# Patient Record
Sex: Male | Born: 1960
Health system: Southern US, Community
[De-identification: ages and names within clinical notes are randomized; demographics above are authoritative.]

## PROBLEM LIST (undated history)

## (undated) DIAGNOSIS — F329 Major depressive disorder, single episode, unspecified: Secondary | ICD-10-CM

## (undated) DIAGNOSIS — E785 Hyperlipidemia, unspecified: Secondary | ICD-10-CM

## (undated) DIAGNOSIS — F32A Depression, unspecified: Secondary | ICD-10-CM

## (undated) DIAGNOSIS — F419 Anxiety disorder, unspecified: Secondary | ICD-10-CM

## (undated) DIAGNOSIS — M069 Rheumatoid arthritis, unspecified: Secondary | ICD-10-CM

## (undated) HISTORY — PX: EYE SURGERY: SHX253

## (undated) HISTORY — DX: Major depressive disorder, single episode, unspecified: F32.9

## (undated) HISTORY — PX: TONSILLECTOMY: SUR1361

## (undated) HISTORY — DX: Hyperlipidemia, unspecified: E78.5

## (undated) HISTORY — DX: Depression, unspecified: F32.A

## (undated) HISTORY — PX: APPENDECTOMY: SHX54

## (undated) HISTORY — DX: Anxiety disorder, unspecified: F41.9

## (undated) HISTORY — DX: Rheumatoid arthritis, unspecified: M06.9

---

## 1992-02-01 HISTORY — PX: STOMACH SURGERY: SHX791

## 2008-03-27 ENCOUNTER — Encounter (INDEPENDENT_AMBULATORY_CARE_PROVIDER_SITE_OTHER): Payer: Self-pay | Admitting: *Deleted

## 2008-10-07 ENCOUNTER — Ambulatory Visit: Payer: Self-pay | Admitting: Family Medicine

## 2008-10-07 DIAGNOSIS — E785 Hyperlipidemia, unspecified: Secondary | ICD-10-CM | POA: Insufficient documentation

## 2008-10-07 LAB — CONVERTED CEMR LAB: HDL goal, serum: 40 mg/dL

## 2008-10-08 LAB — CONVERTED CEMR LAB
Cholesterol: 207 mg/dL — ABNORMAL HIGH (ref 0–200)
HDL: 46.2 mg/dL (ref >39.00)
Total CHOL/HDL Ratio: 4
Triglycerides: 135 mg/dL (ref 0.0–149.0)
VLDL: 27 mg/dL (ref 0.0–40.0)

## 2010-11-24 ENCOUNTER — Ambulatory Visit (INDEPENDENT_AMBULATORY_CARE_PROVIDER_SITE_OTHER): Payer: BC Managed Care – PPO | Admitting: Family Medicine

## 2010-11-24 ENCOUNTER — Encounter: Payer: Self-pay | Admitting: Family Medicine

## 2010-11-24 VITALS — BP 124/84 | HR 78 | Ht 75.0 in | Wt 189.0 lb

## 2010-11-24 DIAGNOSIS — N410 Acute prostatitis: Secondary | ICD-10-CM

## 2010-11-24 DIAGNOSIS — N453 Epididymo-orchitis: Secondary | ICD-10-CM

## 2010-11-24 DIAGNOSIS — N451 Epididymitis: Secondary | ICD-10-CM

## 2010-11-24 DIAGNOSIS — Z209 Contact with and (suspected) exposure to unspecified communicable disease: Secondary | ICD-10-CM

## 2010-11-24 MED ORDER — DOXYCYCLINE HYCLATE 100 MG PO TABS: 100.0000 mg | ORAL_TABLET | Freq: Two times a day (BID) | ORAL | Status: AC

## 2010-11-24 NOTE — Patient Instructions (Signed)
Take the Avelox right now and start the doxycycline as soon as you can

## 2010-11-24 NOTE — Progress Notes (Signed)
  Subjective:    Patient ID: Jacob Pena, male    DOB: [birth year], 50 y.o.   MRN: [id number]  HPI He is here for evaluation of left testicular pain. Approximately 2 weeks ago he did note a change in the color of his semen as well as some slight dysuria. He has had a new sex partner approximately 3 weeks ago. Over this last weekend he noted left testicular pain with swelling and some dysuria. No fever or chills and some back pain. Over the last several months he has had different sexual partners but states that he has practiced safe sex. His last HIV was approximately 5 years ago.   Review of Systems     Objective:   Physical Exam Alert and in no distress. Abdominal exam shows no masses. Right teste slightly atrophied. Left teste is slightly swollen and the epididymis is swollen to the size of the testing. Rectal exam shows a slightly tender boggy prostate. No penile discharge noted.       Assessment & Plan:   1. Epididymitis, left   2. Prostatitis, acute   3. Contact with or exposure to unspecified communicable disease    a sample of Avelox was given. I will also place him on doxycycline. RPR and HIV as well as GC Chlamydia was ordered.

## 2010-11-25 LAB — RPR

## 2010-11-26 LAB — GC/CHLAMYDIA PROBE AMP, GENITAL: Chlamydia, DNA Probe: POSITIVE — AB

## 2010-11-26 NOTE — Progress Notes (Signed)
  Subjective:    Patient ID: Jacob Pena, male    DOB: [birth year], 50 y.o.   MRN: [id number]  HPI    Review of Systems     Objective:   Physical Exam        Assessment & Plan:  His results came back showing positive for Chlamydia. This information was relayed to him. He will continue on doxycycline and call me at the end of the course. Also informed him that that helped probable probably call him. Recommend followup HIV in 3 months.

## 2011-05-04 ENCOUNTER — Encounter: Payer: Self-pay | Admitting: Family Medicine

## 2011-05-04 ENCOUNTER — Ambulatory Visit (INDEPENDENT_AMBULATORY_CARE_PROVIDER_SITE_OTHER): Payer: BC Managed Care – PPO | Admitting: Family Medicine

## 2011-05-04 ENCOUNTER — Ambulatory Visit
Admission: RE | Admit: 2011-05-04 | Discharge: 2011-05-04 | Disposition: A | Discharge status: Home / Self Care | Payer: BC Managed Care – PPO | Source: Ambulatory Visit | Attending: Family Medicine | Admitting: Family Medicine

## 2011-05-04 VITALS — BP 130/80 | HR 78 | Wt 183.0 lb

## 2011-05-04 DIAGNOSIS — M130 Polyarthritis, unspecified: Secondary | ICD-10-CM

## 2011-05-04 MED ORDER — DICLOFENAC SODIUM 75 MG PO TBEC: 75.0000 mg | DELAYED_RELEASE_TABLET | Freq: Two times a day (BID) | ORAL | Status: AC

## 2011-05-04 NOTE — Progress Notes (Signed)
  Subjective:    Patient ID: Jacob Pena, male    DOB: [birth year], 51 y.o.   MRN: [id number]  HPI He complains of a one-month history of swelling redness and tenderness in the left hand DIP joints. He has no other joints involved. He has had no recent illnesses. He has not noted any itching, rash, penile discharge. He has had no recent infections.   Review of Systems     Objective:   Physical Exam The DIP joints of the second third fourth and fifth fingers on the left hand are read and swollen and slightly tender to palpation. Wrist elbow knees and ankles are normal. Skin shows no changes. His nails are normal.       Assessment & Plan:   1. Polyarthropathy or polyarthritis, hand  diclofenac (VOLTAREN) 75 MG EC tablet, DG Hand Complete Left   the case was discussed with Dr. Dareen Piano. This could be a manifestation of psoriasis. If he does not improve probable tearing, I will refer him.

## 2011-05-04 NOTE — Patient Instructions (Signed)
Let me know if the new medication helps at all.

## 2011-05-18 ENCOUNTER — Encounter: Payer: Self-pay | Admitting: Family Medicine

## 2011-05-18 ENCOUNTER — Ambulatory Visit (INDEPENDENT_AMBULATORY_CARE_PROVIDER_SITE_OTHER): Payer: BC Managed Care – PPO | Admitting: Family Medicine

## 2011-05-18 VITALS — BP 114/70 | HR 62 | Wt 176.0 lb

## 2011-05-18 DIAGNOSIS — E7800 Pure hypercholesterolemia, unspecified: Secondary | ICD-10-CM

## 2011-05-18 DIAGNOSIS — E785 Hyperlipidemia, unspecified: Secondary | ICD-10-CM | POA: Insufficient documentation

## 2011-05-18 DIAGNOSIS — Z79899 Other long term (current) drug therapy: Secondary | ICD-10-CM

## 2011-05-18 DIAGNOSIS — M129 Arthropathy, unspecified: Secondary | ICD-10-CM

## 2011-05-18 DIAGNOSIS — E7801 Familial hypercholesterolemia: Secondary | ICD-10-CM

## 2011-05-18 DIAGNOSIS — M1990 Unspecified osteoarthritis, unspecified site: Secondary | ICD-10-CM

## 2011-05-18 NOTE — Progress Notes (Signed)
  Subjective:    Patient ID: Jacob Pena, male    DOB: October 10, 1960, 51 y.o.   MRN: [id number]  HPI He is here for recheck. He continues to have difficulty with swelling and discomfort in the DIP joints bilaterally. No skin changes, other joints involved. No fever, chills, sore throat, weight change. He did use Voltaren which did work initially but since then has not had much benefit. He also continues on Lipitor for treatment of elevated lipid levels. He has a family history of hypercholesterolemia over does not indicate any cardiac disease.   Review of Systems     Objective:   Physical Exam Alert and in no distress. Exam of the skin shows no lesions. Joint exam shows redness and swelling to the DIP joints of second through fifth fingers on the left only       Assessment & Plan:  Inflammatory arthropathy. Hyperlipidemia Family history of hypercholesterolemia Routine blood screening including a lipid panel. Discussed followup on this if all tests are negative. Further rheumatologic evaluation is a possibility versus watchful waiting.

## 2011-05-19 LAB — CBC WITH DIFFERENTIAL/PLATELET
Basophils Relative: 0 % (ref 0–1)
Eosinophils Absolute: 0.1 10*3/uL (ref 0.0–0.7)
Eosinophils Relative: 2 % (ref 0–5)
Lymphs Abs: 1.4 10*3/uL (ref 0.7–4.0)
MCH: 29.2 pg (ref 26.0–34.0)
MCHC: 32.9 g/dL (ref 30.0–36.0)
MCV: 88.9 fL (ref 78.0–100.0)
Neutrophils Relative %: 61 % (ref 43–77)
Platelets: 271 10*3/uL (ref 150–400)
RBC: 4.76 MIL/uL (ref 4.22–5.81)

## 2011-05-19 LAB — LIPID PANEL
HDL: 59 mg/dL (ref >39)
LDL Cholesterol: 98 mg/dL (ref 0–99)
Total CHOL/HDL Ratio: 2.9 Ratio
VLDL: 13 mg/dL (ref 0–40)

## 2011-05-19 LAB — C-REACTIVE PROTEIN: CRP: 0.05 mg/dL (ref <0.60)

## 2011-05-19 LAB — COMPREHENSIVE METABOLIC PANEL
ALT: 19 U/L (ref 0–53)
Alkaline Phosphatase: 58 U/L (ref 39–117)
CO2: 23 mEq/L (ref 19–32)
Creat: 0.95 mg/dL (ref 0.50–1.35)
Glucose, Bld: 96 mg/dL (ref 70–99)
Sodium: 135 mEq/L (ref 135–145)
Total Bilirubin: 0.5 mg/dL (ref 0.3–1.2)

## 2011-05-19 LAB — ANA: Anti Nuclear Antibody(ANA): NEGATIVE

## 2011-07-14 ENCOUNTER — Encounter: Payer: Self-pay | Admitting: Family Medicine

## 2011-07-14 ENCOUNTER — Ambulatory Visit (INDEPENDENT_AMBULATORY_CARE_PROVIDER_SITE_OTHER): Payer: BC Managed Care – PPO | Admitting: Family Medicine

## 2011-07-14 VITALS — BP 100/60 | HR 70 | Wt 170.0 lb

## 2011-07-14 DIAGNOSIS — K645 Perianal venous thrombosis: Secondary | ICD-10-CM

## 2011-07-14 DIAGNOSIS — M129 Arthropathy, unspecified: Secondary | ICD-10-CM

## 2011-07-14 DIAGNOSIS — M1990 Unspecified osteoarthritis, unspecified site: Secondary | ICD-10-CM

## 2011-07-14 NOTE — Patient Instructions (Signed)
Use heat to the area for 20 minutes a couple times per day. Make sure you have softer BM

## 2011-07-14 NOTE — Progress Notes (Signed)
  Subjective:    Patient ID: Jacob Pena, male    DOB: August 12, 1960, 51 y.o.   MRN: [id number]  HPI He is here for evaluation of a lesion in the anal area that he's had for the last 10 days. He has very little discomfort with it. He has had no difficulty with constipation recently. He was recently seen by his previous physician in [facility]. He was placed on a tapered dose of steroids starting at 40 mg and tapering every week. He states that it has helped his arthropathy by about 80%. He does have an appointment to be followed up locally by a rheumatologist tomorrow.  Review of Systems     Objective:   Physical Exam Alert and in no distress. Exam of the left hand does show swelling and erythema to the DIP joints. Rectal exam does show a thrombosed hemorrhoid at 3:00 it is nontender to palpation.       Assessment & Plan:   1. Thrombosed external hemorrhoid   2. Inflammatory arthropathy    no particular comments concerning the arthropathy. Did recommend heat and ensure softer BMs. If the symptoms get worse specifically pain, he is to call for surgical intervention.

## 2011-11-17 ENCOUNTER — Other Ambulatory Visit (HOSPITAL_COMMUNITY): Payer: Self-pay | Admitting: Rheumatology

## 2011-11-17 ENCOUNTER — Other Ambulatory Visit (INDEPENDENT_AMBULATORY_CARE_PROVIDER_SITE_OTHER): Payer: BC Managed Care – PPO

## 2011-11-17 ENCOUNTER — Ambulatory Visit (HOSPITAL_COMMUNITY)
Admission: RE | Admit: 2011-11-17 | Discharge: 2011-11-17 | Disposition: A | Discharge status: Home / Self Care | Payer: BC Managed Care – PPO | Source: Ambulatory Visit | Attending: Rheumatology | Admitting: Rheumatology

## 2011-11-17 DIAGNOSIS — Z01818 Encounter for other preprocedural examination: Secondary | ICD-10-CM | POA: Insufficient documentation

## 2011-11-17 DIAGNOSIS — R079 Chest pain, unspecified: Secondary | ICD-10-CM | POA: Insufficient documentation

## 2011-11-17 DIAGNOSIS — Z23 Encounter for immunization: Secondary | ICD-10-CM

## 2011-11-17 DIAGNOSIS — Z0000 Encounter for general adult medical examination without abnormal findings: Secondary | ICD-10-CM

## 2011-11-17 DIAGNOSIS — R52 Pain, unspecified: Secondary | ICD-10-CM

## 2011-11-17 NOTE — Progress Notes (Signed)
Pt had RX from [facility] it has been filed in chart had to have because of med he is on

## 2013-03-27 ENCOUNTER — Telehealth: Payer: Self-pay | Admitting: Family Medicine

## 2013-03-27 DIAGNOSIS — Z1211 Encounter for screening for malignant neoplasm of colon: Secondary | ICD-10-CM

## 2013-03-27 NOTE — Telephone Encounter (Signed)
Set this up 

## 2013-03-27 NOTE — Telephone Encounter (Signed)
Sent to [facility]. They should contact him

## 2013-04-03 ENCOUNTER — Telehealth: Payer: Self-pay | Admitting: Family Medicine

## 2013-04-03 ENCOUNTER — Encounter: Payer: Self-pay | Admitting: Gastroenterology

## 2013-04-03 NOTE — Telephone Encounter (Signed)
Pt called for referral status of colonoscopy.  I called [facility] [phone] t/w Christy. She states pt can call over now and she will schedule with him.  I advised pt and he will do same.

## 2013-05-07 ENCOUNTER — Ambulatory Visit (AMBULATORY_SURGERY_CENTER): Payer: Self-pay

## 2013-05-07 VITALS — Ht 74.5 in | Wt 183.8 lb

## 2013-05-07 DIAGNOSIS — Z800 Family history of malignant neoplasm of digestive organs: Secondary | ICD-10-CM

## 2013-05-07 MED ORDER — MOVIPREP 100 G PO SOLR: ORAL | Status: DC

## 2013-05-09 ENCOUNTER — Encounter: Payer: Self-pay | Admitting: Gastroenterology

## 2013-05-21 ENCOUNTER — Encounter: Payer: Self-pay | Admitting: Gastroenterology

## 2013-05-21 ENCOUNTER — Ambulatory Visit (AMBULATORY_SURGERY_CENTER): Payer: BC Managed Care – PPO | Admitting: Gastroenterology

## 2013-05-21 VITALS — BP 116/62 | HR 52 | Temp 97.9°F | Resp 33 | Ht 74.5 in | Wt 183.0 lb

## 2013-05-21 DIAGNOSIS — Z1211 Encounter for screening for malignant neoplasm of colon: Secondary | ICD-10-CM

## 2013-05-21 DIAGNOSIS — D126 Benign neoplasm of colon, unspecified: Secondary | ICD-10-CM

## 2013-05-21 DIAGNOSIS — Z800 Family history of malignant neoplasm of digestive organs: Secondary | ICD-10-CM

## 2013-05-21 MED ORDER — SODIUM CHLORIDE 0.9 % IV SOLN: 500.0000 mL | INTRAVENOUS | Status: DC

## 2013-05-21 NOTE — Patient Instructions (Signed)
YOU HAD AN ENDOSCOPIC PROCEDURE TODAY AT THE [facility]: Refer to the procedure report that was given to you for any specific questions about what was found during the examination.  If the procedure report does not answer your questions, please call your gastroenterologist to clarify.  If you requested that your care partner not be given the details of your procedure findings, then the procedure report has been included in a sealed envelope for you to review at your convenience later.  YOU SHOULD EXPECT: Some feelings of bloating in the abdomen. Passage of more gas than usual.  Walking can help get rid of the air that was put into your GI tract during the procedure and reduce the bloating. If you had a lower endoscopy (such as a colonoscopy or flexible sigmoidoscopy) you may notice spotting of blood in your stool or on the toilet paper. If you underwent a bowel prep for your procedure, then you may not have a normal bowel movement for a few days.  DIET: Your first meal following the procedure should be a light meal and then it is ok to progress to your normal diet.  A half-sandwich or bowl of soup is an example of a good first meal.  Heavy or fried foods are harder to digest and may make you feel nauseous or bloated.  Likewise meals heavy in dairy and vegetables can cause extra gas to form and this can also increase the bloating.  Drink plenty of fluids but you should avoid alcoholic beverages for 24 hours.  ACTIVITY: Your care partner should take you home directly after the procedure.  You should plan to take it easy, moving slowly for the rest of the day.  You can resume normal activity the day after the procedure however you should NOT DRIVE or use heavy machinery for 24 hours (because of the sedation medicines used during the test).    SYMPTOMS TO REPORT IMMEDIATELY: A gastroenterologist can be reached at any hour.  During normal business hours, 8:30 AM to 5:00 PM Monday through Friday,  call [phone].  After hours and on weekends, please call the GI answering service at [phone] who will take a message and have the physician on call contact you.   Following lower endoscopy (colonoscopy or flexible sigmoidoscopy):  Excessive amounts of blood in the stool  Significant tenderness or worsening of abdominal pains  Swelling of the abdomen that is new, acute  Fever of 100F or higher  FOLLOW UP: If any biopsies were taken you will be contacted by phone or by letter within the next 1-3 weeks.  Call your gastroenterologist if you have not heard about the biopsies in 3 weeks.  Our staff will call the home number listed on your records the next business day following your procedure to check on you and address any questions or concerns that you may have at that time regarding the information given to you following your procedure. This is a courtesy call and so if there is no answer at the home number and we have not heard from you through the emergency physician on call, we will assume that you have returned to your regular daily activities without incident.  SIGNATURES/CONFIDENTIALITY: You and/or your care partner have signed paperwork which will be entered into your electronic medical record.  These signatures attest to the fact that that the information above on your After Visit Summary has been reviewed and is understood.  Full responsibility of the confidentiality of this   discharge information lies with you and/or your care-partner.  Recommendations Next colonoscopy determined by pathology results, 5 or 10 years. 

## 2013-05-21 NOTE — Progress Notes (Signed)
Report to pacu rn, vss, bbs=clear 

## 2013-05-21 NOTE — Progress Notes (Signed)
Called to room to assist during endoscopic procedure.  Patient ID and intended procedure confirmed with present staff. Received instructions for my participation in the procedure from the performing physician.  

## 2013-05-21 NOTE — Op Note (Signed)
[facility]  [address]. [place], [address]   COLONOSCOPY PROCEDURE REPORT  PATIENT: Jacob Pena, Jacob Pena  MR#: [id number] BIRTHDATE: 08-05-1960 , 53  yrs. old GENDER: Male ENDOSCOPIST: Milus Banister, MD REFERRED KG:YJEH [employer], M.D. PROCEDURE DATE:  05/21/2013 PROCEDURE:   Colonoscopy with snare polypectomy First Screening Colonoscopy - Avg.  risk and is 50 yrs.  old or older - No.  Prior Negative Screening - Now for repeat screening. 10 or more years since last screening  History of Adenoma - Now for follow-up colonoscopy & has been > or = to 3 yrs.  N/A  Polyps Removed Today? Yes. ASA CLASS:   Class II INDICATIONS:average risk screening, colonoscopy 10 years ago was normal per patient. MEDICATIONS: MAC sedation, administered by CRNA and propofol (Diprivan) 250mg  IV  DESCRIPTION OF PROCEDURE:   After the risks benefits and alternatives of the procedure were thoroughly explained, informed consent was obtained.  A digital rectal exam revealed no abnormalities of the rectum.   The LB [id number]  endoscope was introduced through the anus and advanced to the cecum, which was identified by both the appendix and ileocecal valve. No adverse events experienced.   The quality of the prep was excellent.  The instrument was then slowly withdrawn as the colon was fully examined.   COLON FINDINGS: One polyp was found, removed and sent to pathology. This was 73mm across, located in transverse segment, sessile, removed with cold snare.  The examination was otherwise normal. Retroflexed views revealed no abnormalities. The time to cecum=2 minutes 31 seconds.  Withdrawal time=9 minutes 22 seconds.  The scope was withdrawn and the procedure completed. COMPLICATIONS: There were no complications.  ENDOSCOPIC IMPRESSION: One polyp was found, removed and sent to pathology. The examination was otherwise normal.  RECOMMENDATIONS: If the polyp(s) removed today are proven  to be adenomatous (pre-cancerous) polyps, you will need a repeat colonoscopy in 5 years.  Otherwise you should continue to follow colorectal cancer screening guidelines for "routine risk" patients with colonoscopy in 10 years.  You will receive a letter within 1-2 weeks with the results of your biopsy as well as final recommendations.  Please call my office if you have not received a letter after 3 weeks.   eSigned:  Milus Banister, MD 05/21/2013 8:15 AM

## 2013-05-22 ENCOUNTER — Telehealth: Payer: Self-pay

## 2013-05-22 NOTE — Telephone Encounter (Signed)
Left message on answering machine. 

## 2013-05-28 ENCOUNTER — Encounter: Payer: Self-pay | Admitting: Gastroenterology

## 2013-06-02 IMAGING — CR DG CHEST 2V
2 series · 2 of 2 positions shown · non-contrast
Comparison: None.

CLINICAL DATA: Starting immunosuppressive therapy

CHEST - 2 VIEW

[w chest pa]
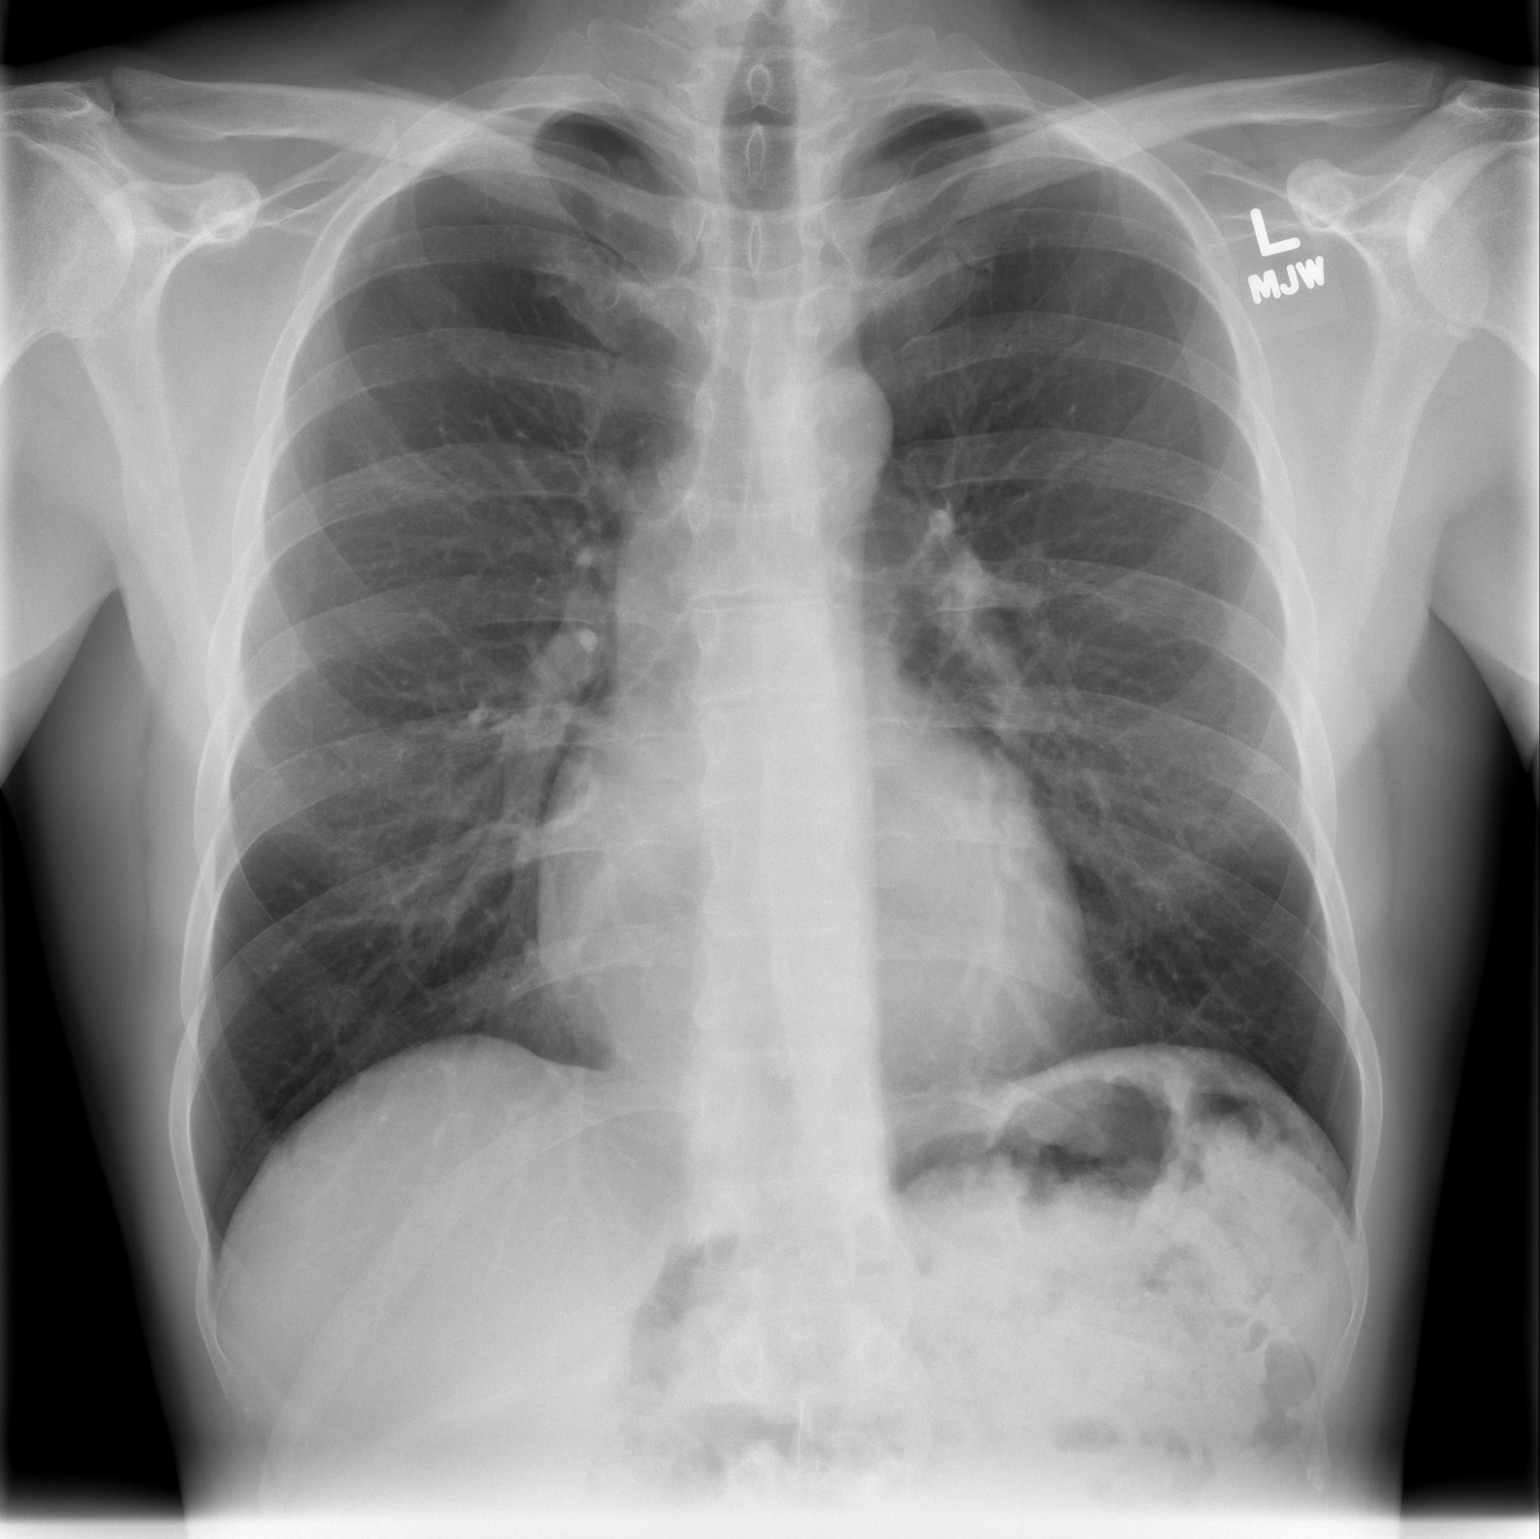

[w chest lat]
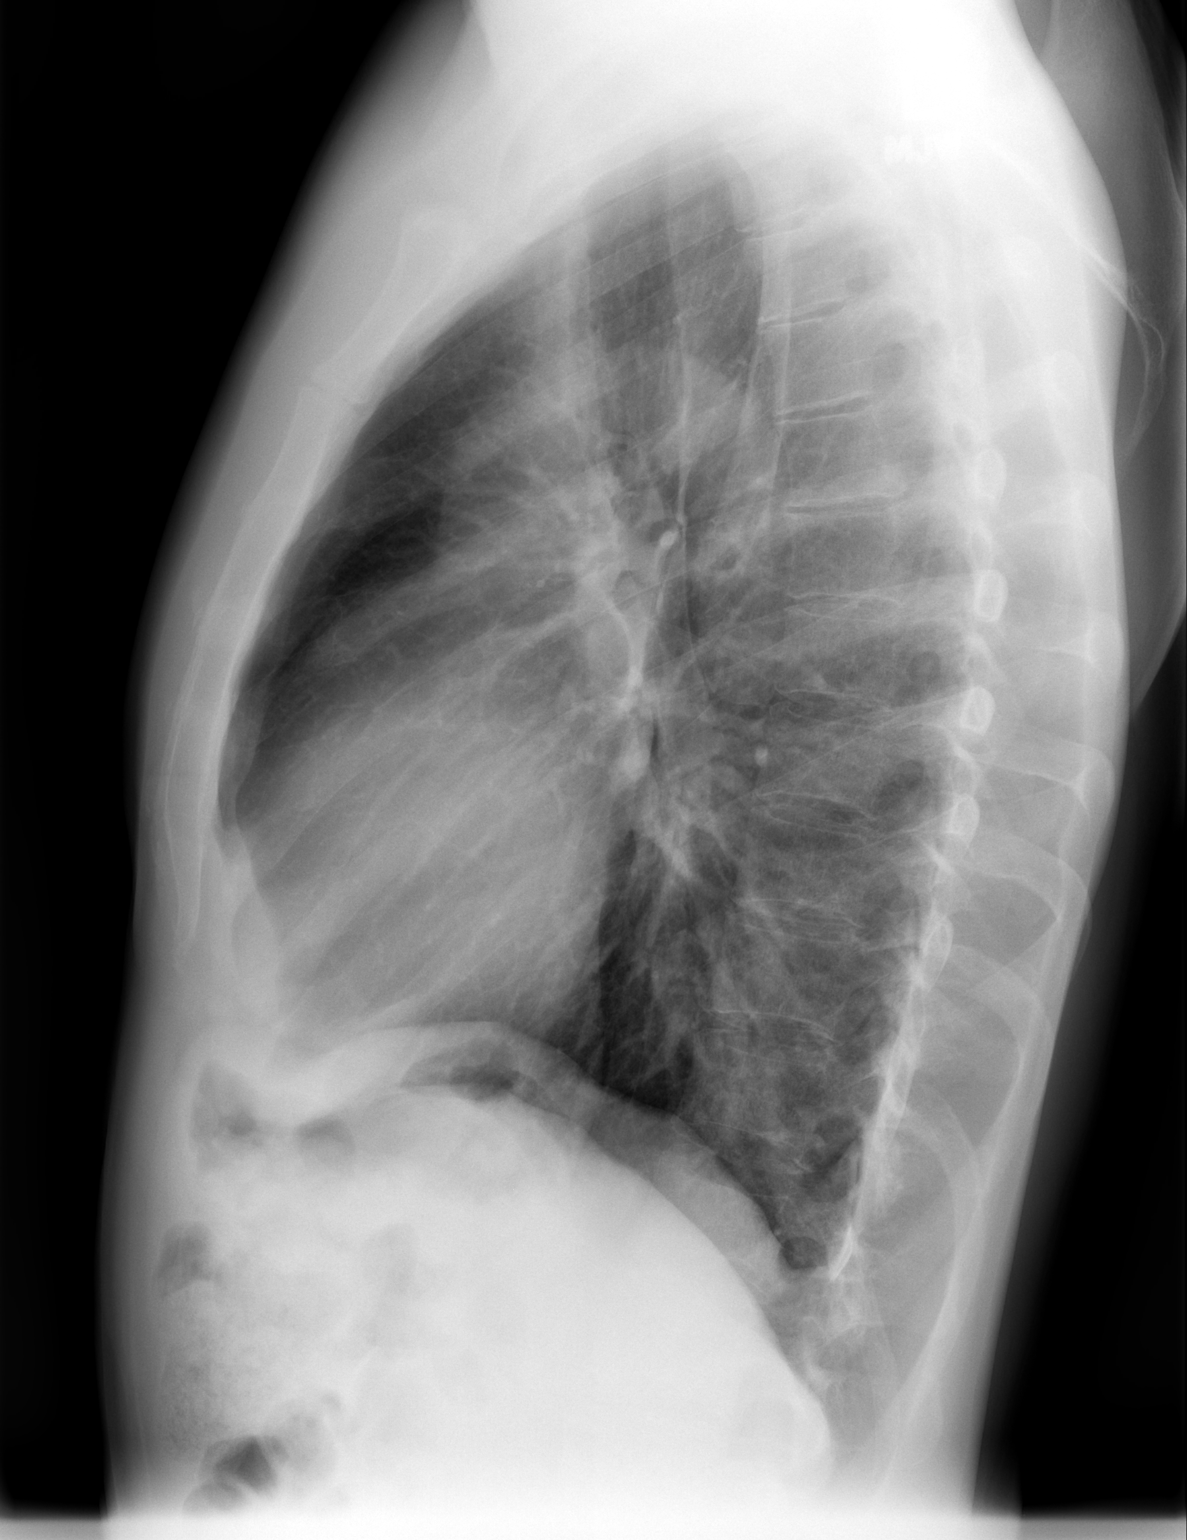

[2 of 2 positions shown; findings below may reference images not displayed]

FINDINGS: Cardiomediastinal silhouette is unremarkable.  No acute
infiltrate or pleural effusion.  No pulmonary edema.  Bony thorax
is unremarkable.
IMPRESSION: No active disease.

## 2013-11-05 ENCOUNTER — Encounter: Payer: Self-pay | Admitting: Internal Medicine

## 2016-01-08 DIAGNOSIS — M47819 Spondylosis without myelopathy or radiculopathy, site unspecified: Secondary | ICD-10-CM | POA: Insufficient documentation

## 2016-01-08 DIAGNOSIS — L4050 Arthropathic psoriasis, unspecified: Secondary | ICD-10-CM | POA: Insufficient documentation

## 2016-01-08 DIAGNOSIS — Z79899 Other long term (current) drug therapy: Secondary | ICD-10-CM | POA: Insufficient documentation

## 2016-01-08 NOTE — Progress Notes (Signed)
Office Visit Note  Patient: Jacob Pena             Date of Birth: 05-28-1960           MRN: [id number]             PCP: Wyatt Haste, MD Referring: Denita Lung, MD Visit Date: 01/12/2016 Occupation: Homeowners adjuster    Subjective:  Right second finger swelling   History of Present Illness: Jacob Pena is a 55 y.o. male with history of psoriatic arthritis. He states he's been off [name] a for the last 5 months. He's been trying to take some natural supplements she's not been very helpful. He's been off Humira for almost one year. He denies any psoriasis flare. He is been very active and been taking classes at the gym. He takes ibuprofen when necessary for lower back pain.   Activities of Daily Living:  Patient reports morning stiffness for5  minutes.   Patient Denies nocturnal pain.  Difficulty dressing/grooming: Denies Difficulty climbing stairs: Denies Difficulty getting out of chair: Denies Difficulty using hands for taps, buttons, cutlery, and/or writing: Denies   Review of Systems  Constitutional: Negative for fatigue, night sweats and weakness ( ).  HENT: Negative for mouth sores, mouth dryness and nose dryness.   Eyes: Negative for redness and dryness.  Respiratory: Negative for shortness of breath and difficulty breathing.   Cardiovascular: Negative for chest pain, palpitations, hypertension, irregular heartbeat and swelling in legs/feet.  Gastrointestinal: Negative for constipation and diarrhea.  Endocrine: Negative for increased urination.  Musculoskeletal: Positive for arthralgias, joint pain, joint swelling and morning stiffness. Negative for myalgias, muscle weakness, muscle tenderness and myalgias.  Skin: Negative for color change, rash, hair loss, nodules/bumps, skin tightness, ulcers and sensitivity to sunlight.  Allergic/Immunologic: Negative for susceptible to infections.  Neurological: Negative for dizziness, fainting, memory loss and  night sweats.  Hematological: Negative for swollen glands.  Psychiatric/Behavioral: Negative for depressed mood and sleep disturbance. The patient is not nervous/anxious.     PMFS History:  Patient Active Problem List   Diagnosis Date Noted  . Psoriatic arthritis ([place]) 01/08/2016  . Spondyloarthropathy ([place]) 01/08/2016  . High risk medication use 01/08/2016  . Familial hypercholesterolemia 05/18/2011  . Dyslipidemia 10/07/2008    Past Medical History:  Diagnosis Date  . Anxiety   . Depression   . Rheumatoid arthritis ([place])     Family History  Problem Relation Age of Onset  . Colon cancer Paternal Grandmother   . Diverticulitis Mother   . Breast cancer Mother   . Prostate cancer Father    Past Surgical History:  Procedure Laterality Date  . APPENDECTOMY    . EYE SURGERY     left eye/prosthetic eye  . STOMACH SURGERY     floating ligament  . TONSILLECTOMY     Social History   Social History Narrative  . No narrative on file     Objective: Vital Signs: BP 132/74   Pulse 62   Resp 16   Ht 6' 2.5" (1.892 m)   Wt 192 lb (87.1 kg)   BMI 24.32 kg/m    Physical Exam  Constitutional: He is oriented to person, place, and time. He appears well-developed and well-nourished.  HENT:  Head: Normocephalic and atraumatic.  Eyes: Conjunctivae and EOM are normal. Pupils are equal, round, and reactive to light.  Neck: Normal range of motion. Neck supple.  Cardiovascular: Normal rate, regular rhythm and normal heart sounds.   Pulmonary/Chest: Effort normal  and breath sounds normal.  Abdominal: Soft. Bowel sounds are normal.  Neurological: He is alert and oriented to person, place, and time.  Skin: Skin is warm and dry. Capillary refill takes less than 2 seconds.  Psychiatric: He has a normal mood and affect. His behavior is normal.  Nursing note and vitals reviewed.    Musculoskeletal Exam: C-spine, thoracic, lumbar spine good range of motion had no SI joint tenderness.  Shoulder joints elbow joints wrist joints with good range of motion he has thickening of bilateral DIP joints synovitis of his right second digit DIP this significant thickening of the right fifth digit DIP but not much synovitis was noted. Hip joints knee joints ankles MTPs PIPs with good range of motion with no synovitis.  CDAI Exam: CDAI Homunculus Exam:   Tenderness:  Right hand: 2nd DIP and 5th DIP  Swelling:  Right hand: 2nd DIP and 5th DIP  Joint Counts:  CDAI Tender Joint count: 0 CDAI Swollen Joint count: 0  Global Assessments:  Patient Global Assessment: 3 Provider Global Assessment: 3  CDAI Calculated Score: 6    Investigation: Findings:  08/11/2015 CBC CMP normal  09/12/2014 TB gold is negative  Labs are reviewed from July 2013. sedimentation rate, hepatitis panel, AST, ALT, creatinine, albumin, CCP, HLA-B27, and TB gold were all negative.      Imaging: No results found.  Speciality Comments: No specialty comments available.    Procedures:  No procedures performed Allergies: Penicillins   Assessment / Plan:     Visit Diagnoses: Psoriatic arthritis ([place]) - severe, aggressive psoriatic arthritis with DIP involvement . He is having a flare off methotrexate. He was on Humira in the past which she discontinued about a year ago and stopped or trucks about 5 months ago. He is having a flare and he realizes this because of coming off the medications we discussed resuming the medication he is in agreement.  Spondyloarthropathy [facility]): He continues to have some lower back pain.  High risk medication use - we will start [name] at 20 mg sq q week, folic acid 2 mg by mouth daily, was on Humira from 1/ 2014 until 05/2015  Dyslipidemia  Prosthetic eye globe - Left    Orders: No orders of the defined types were placed in this encounter.  No orders of the defined types were placed in this encounter.   Face-to-face time spent with patient was 30 minutes. 50% of  time was spent in counseling and coordination of care.  Follow-Up Instructions: Return in about 5 months (around 06/11/2016) for Psoriatic arthritis.   Bo Merino, MD

## 2016-01-09 DIAGNOSIS — H0520 Unspecified exophthalmos: Secondary | ICD-10-CM | POA: Insufficient documentation

## 2016-01-12 ENCOUNTER — Encounter: Payer: Self-pay | Admitting: Rheumatology

## 2016-01-12 ENCOUNTER — Ambulatory Visit (INDEPENDENT_AMBULATORY_CARE_PROVIDER_SITE_OTHER): Payer: BLUE CROSS/BLUE SHIELD | Admitting: Rheumatology

## 2016-01-12 VITALS — BP 132/74 | HR 62 | Resp 16 | Ht 74.5 in | Wt 192.0 lb

## 2016-01-12 DIAGNOSIS — E785 Hyperlipidemia, unspecified: Secondary | ICD-10-CM | POA: Diagnosis not present

## 2016-01-12 DIAGNOSIS — Z79899 Other long term (current) drug therapy: Secondary | ICD-10-CM

## 2016-01-12 DIAGNOSIS — M4690 Unspecified inflammatory spondylopathy, site unspecified: Secondary | ICD-10-CM | POA: Diagnosis not present

## 2016-01-12 DIAGNOSIS — Z970 Presence of artificial eye: Secondary | ICD-10-CM

## 2016-01-12 DIAGNOSIS — L4050 Arthropathic psoriasis, unspecified: Secondary | ICD-10-CM

## 2016-01-12 DIAGNOSIS — M47819 Spondylosis without myelopathy or radiculopathy, site unspecified: Secondary | ICD-10-CM

## 2016-01-12 MED ORDER — FOLIC ACID 1 MG PO TABS: 2.0000 mg | ORAL_TABLET | Freq: Every day | ORAL | 4 refills | Status: DC

## 2016-01-12 MED ORDER — METHOTREXATE (PF) 20 MG/0.4ML ~~LOC~~ SOAJ: 20.0000 mg | SUBCUTANEOUS | 0 refills | Status: DC

## 2016-01-12 MED ORDER — METHOTREXATE (PF) 20 MG/0.4ML ~~LOC~~ SOAJ: 20.0000 mg | Freq: Once | SUBCUTANEOUS | 0 refills | Status: DC

## 2016-01-12 NOTE — Patient Instructions (Signed)
Standing Labs We placed an order today for your standing lab work.    Please come back and get your standing labs in 1 month then every 3 months  We have open lab Monday through Friday from 8:30-11:30 AM and 1:30-4 PM at the office of Dr. Sherrick Araki/Naitik Panwala, PA.   The office is located at [address], [address], [place], [place] [address] No appointment is necessary.   Labs are drawn by [facility].  You may receive a bill from [facility] for your lab work.     

## 2016-01-13 NOTE — Progress Notes (Signed)
Pharmacy Note Subjective: Patient presents today to the [facility] to see Dr. Estanislado Pandy.  Patient seen by the pharmacist for counseling on methotrexate.    Objective: CBC, CMP normal on 08/11/2015 TB Gold: negative (09/12/2014) Hepatitis panel: negative (08/2011)  Chest-xray:  11/17/2010: "IMPRESSION: No active disease."   Assessment/Plan:  Patient was re-initiated on methotrexate 20 mg.  Patient was counseled on the purpose, proper use, and adverse effects of methotrexate including nausea, infection, and signs and symptoms of pneumonitis.  Reviewed instructions with patient to take methotrexate 20 mg weekly along with folic acid daily.  Discussed the importance of frequent monitoring of kidney and liver function and blood counts, and provided patient with standing lab instructions.  Counseled patient to avoid sulfa antibiotics such as Bactrim or Septra while on methotrexate.  Provided patient with educational materials on methotrexate and answered all questions.  Advised patient to get annual influenza vaccine and to get a pneumococcal vaccine if patient has not already had one.  Patient voiced understanding.    Elisabeth Most, Pharm.D., BCPS Clinical Pharmacist Pager: [phone] Phone: [phone] 01/13/2016 6:27 AM

## 2016-01-15 ENCOUNTER — Other Ambulatory Visit (INDEPENDENT_AMBULATORY_CARE_PROVIDER_SITE_OTHER): Payer: BLUE CROSS/BLUE SHIELD

## 2016-01-15 DIAGNOSIS — Z23 Encounter for immunization: Secondary | ICD-10-CM | POA: Diagnosis not present

## 2016-04-04 ENCOUNTER — Other Ambulatory Visit: Payer: Self-pay | Admitting: Rheumatology

## 2016-04-04 ENCOUNTER — Other Ambulatory Visit: Payer: Self-pay | Admitting: Radiology

## 2016-04-04 DIAGNOSIS — Z79899 Other long term (current) drug therapy: Secondary | ICD-10-CM

## 2016-04-04 LAB — CBC WITH DIFFERENTIAL/PLATELET
BASOS ABS: 43 {cells}/uL (ref 0–200)
Basophils Relative: 1 %
EOS PCT: 1 %
Eosinophils Absolute: 43 cells/uL (ref 15–500)
HCT: 42.8 % (ref 38.5–50.0)
HEMOGLOBIN: 13.9 g/dL (ref 13.2–17.1)
LYMPHS ABS: 1677 {cells}/uL (ref 850–3900)
Lymphocytes Relative: 39 %
MCH: 29.7 pg (ref 27.0–33.0)
MCHC: 32.5 g/dL (ref 32.0–36.0)
MCV: 91.5 fL (ref 80.0–100.0)
MPV: 9.6 fL (ref 7.5–12.5)
Monocytes Absolute: 430 cells/uL (ref 200–950)
Monocytes Relative: 10 %
NEUTROS PCT: 49 %
Neutro Abs: 2107 cells/uL (ref 1500–7800)
Platelets: 256 10*3/uL (ref 140–400)
RBC: 4.68 MIL/uL (ref 4.20–5.80)
RDW: 15 % (ref 11.0–15.0)
WBC: 4.3 10*3/uL (ref 3.8–10.8)

## 2016-04-04 LAB — COMPLETE METABOLIC PANEL WITH GFR
ALBUMIN: 4.3 g/dL (ref 3.6–5.1)
ALK PHOS: 57 U/L (ref 40–115)
ALT: 22 U/L (ref 9–46)
AST: 28 U/L (ref 10–35)
BILIRUBIN TOTAL: 0.7 mg/dL (ref 0.2–1.2)
BUN: 22 mg/dL (ref 7–25)
CO2: 27 mmol/L (ref 20–31)
Calcium: 9.2 mg/dL (ref 8.6–10.3)
Chloride: 103 mmol/L (ref 98–110)
Creat: 1.01 mg/dL (ref 0.70–1.33)
GFR, EST NON AFRICAN AMERICAN: 85 mL/min (ref >=60)
Glucose, Bld: 108 mg/dL — ABNORMAL HIGH (ref 65–99)
POTASSIUM: 5 mmol/L (ref 3.5–5.3)
SODIUM: 138 mmol/L (ref 135–146)
TOTAL PROTEIN: 6.5 g/dL (ref 6.1–8.1)

## 2016-04-04 NOTE — Telephone Encounter (Signed)
Last Visit :01/12/16 Next Visit: 06/02/16 Labs: 08/11/15 WNL Patient is coming to office today to update labs  Okay to fill 30 supply [name]?

## 2016-04-04 NOTE — Telephone Encounter (Signed)
I will refill after blood is drawn.

## 2016-04-05 NOTE — Progress Notes (Signed)
Labs normal.

## 2016-05-27 NOTE — Progress Notes (Signed)
Office Visit Note  Patient: Jacob Pena             Date of Birth: [birth year]           MRN: [id number]             PCP: Wyatt Haste, MD Referring: Denita Lung, MD Visit Date: 06/02/2016 Occupation: _0 @    Subjective:  Right hand swelling.   History of Present Illness: Jacob Pena is a 56 y.o. male with history of psoriatic arthritis. He states he had a flare about 2 weeks ago with increase swelling and pain in his right hand. The swelling is decreasing now. He is still taking [name] 20 mg every week. He's been off Humira for a year now. None of the joints are painful. He denies any SI joint pain or spinal discomfort.  Activities of Daily Living:  Patient reports morning stiffness for 0 minute.   Patient Denies nocturnal pain.  Difficulty dressing/grooming: Denies Difficulty climbing stairs: Denies Difficulty getting out of chair: Denies Difficulty using hands for taps, buttons, cutlery, and/or writing: Reports   Review of Systems  Constitutional: Negative for fatigue, night sweats, weight gain, weight loss and weakness.  HENT: Negative for mouth sores, mouth dryness and nose dryness.   Eyes: Negative for redness and dryness.  Respiratory: Negative for shortness of breath and difficulty breathing.   Cardiovascular: Negative for chest pain, palpitations, hypertension, irregular heartbeat and swelling in legs/feet.  Gastrointestinal: Negative for constipation and diarrhea.  Endocrine: Negative for increased urination.  Musculoskeletal: Positive for arthralgias, joint pain and joint swelling. Negative for myalgias, muscle weakness, muscle tenderness and myalgias.  Skin: Negative for color change, rash, hair loss, nodules/bumps, skin tightness, ulcers and sensitivity to sunlight.  Allergic/Immunologic: Negative for susceptible to infections.  Neurological: Negative for dizziness, fainting, memory loss and night sweats.  Hematological: Negative for swollen  glands.  Psychiatric/Behavioral: Negative for depressed mood and sleep disturbance. The patient is not nervous/anxious.     PMFS History:  Patient Active Problem List   Diagnosis Date Noted  . 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 Psoriatic arthritis ([place]) 01/08/2016  . Spondyloarthropathy ([place]) 01/08/2016  . High risk medication use 01/08/2016  . Familial hypercholesterolemia 05/18/2011  . Dyslipidemia 10/07/2008    Past Medical History:  Diagnosis Date  . Anxiety   . Depression   . Rheumatoid arthritis ([place])     Family History  Problem Relation Age of Onset  . Colon cancer Paternal Grandmother   . Diverticulitis Mother   . Breast cancer Mother   . Prostate cancer Father    Past Surgical History:  Procedure Laterality Date  . APPENDECTOMY    . EYE SURGERY     left eye/prosthetic eye  . STOMACH SURGERY     floating ligament  . TONSILLECTOMY     Social History   Social History Narrative  . No narrative on file     Objective: Vital Signs: BP 120/64   Pulse 72   Resp 16   Wt 183 lb (83 kg)   BMI 23.18 kg/m    Physical Exam  Constitutional: He is oriented to person, place, and time. He appears well-developed and well-nourished.  HENT:  Head: Normocephalic and atraumatic.  Eyes: Conjunctivae and EOM are normal. Pupils are equal, round, and reactive to light.  Neck: Normal range of motion. Neck supple.  Cardiovascular: Normal rate, regular rhythm and normal heart sounds.   Pulmonary/Chest: Effort normal and breath sounds normal.  Abdominal: Soft.  Bowel sounds are normal.  Neurological: He is alert and oriented to person, place, and time.  Skin: Skin is warm and dry. Capillary refill takes less than 2 seconds.  Psychiatric: He has a normal mood and affect. His behavior is normal.  Nursing note and vitals reviewed.    Musculoskeletal Exam: C-spine and thoracic lumbar spine good range of motion. Shoulder joints although joints wrist joint with good range of  motion. He is synovitis over right first and fourth PIP joint him time. Although joints afford range of motion with no synovitis. Hip joints knee joints ankles MTPs PIPs with good range of motion. He had no SI joint tenderness.  CDAI Exam: No CDAI exam completed.    Investigation: No additional findings. Orders Only on 04/04/2016  Component Date Value Ref Range Status  . WBC 04/04/2016 4.3  3.8 - 10.8 K/uL Final  . RBC 04/04/2016 4.68  4.20 - 5.80 MIL/uL Final  . Hemoglobin 04/04/2016 13.9  13.2 - 17.1 g/dL Final  . HCT 04/04/2016 42.8  38.5 - 50.0 % Final  . MCV 04/04/2016 91.5  80.0 - 100.0 fL Final  . MCH 04/04/2016 29.7  27.0 - 33.0 pg Final  . MCHC 04/04/2016 32.5  32.0 - 36.0 g/dL Final  . RDW 04/04/2016 15.0  11.0 - 15.0 % Final  . Platelets 04/04/2016 256  140 - 400 K/uL Final  . MPV 04/04/2016 9.6  7.5 - 12.5 fL Final  . Neutro Abs 04/04/2016 2107  1,500 - 7,800 cells/uL Final  . Lymphs Abs 04/04/2016 1677  850 - 3,900 cells/uL Final  . Monocytes Absolute 04/04/2016 430  200 - 950 cells/uL Final  . Eosinophils Absolute 04/04/2016 43  15 - 500 cells/uL Final  . Basophils Absolute 04/04/2016 43  0 - 200 cells/uL Final  . Neutrophils Relative % 04/04/2016 49  % Final  . Lymphocytes Relative 04/04/2016 39  % Final  . Monocytes Relative 04/04/2016 10  % Final  . Eosinophils Relative 04/04/2016 1  % Final  . Basophils Relative 04/04/2016 1  % Final  . Smear Review 04/04/2016 Criteria for review not met   Final  . Sodium 04/04/2016 138  135 - 146 mmol/L Final  . Potassium 04/04/2016 5.0  3.5 - 5.3 mmol/L Final  . Chloride 04/04/2016 103  98 - 110 mmol/L Final  . CO2 04/04/2016 27  20 - 31 mmol/L Final  . Glucose, Bld 04/04/2016 108* 65 - 99 mg/dL Final  . BUN 04/04/2016 22  7 - 25 mg/dL Final  . Creat 04/04/2016 1.01  0.70 - 1.33 mg/dL Final   Comment:   For patients > or = 54 years of age: The upper reference limit for Creatinine is approximately 13% higher for people  identified as African-American.     . Total Bilirubin 04/04/2016 0.7  0.2 - 1.2 mg/dL Final  . Alkaline Phosphatase 04/04/2016 57  40 - 115 U/L Final  . AST 04/04/2016 28  10 - 35 U/L Final  . ALT 04/04/2016 22  9 - 46 U/L Final  . Total Protein 04/04/2016 6.5  6.1 - 8.1 g/dL Final  . Albumin 04/04/2016 4.3  3.6 - 5.1 g/dL Final  . Calcium 04/04/2016 9.2  8.6 - 10.3 mg/dL Final  . GFR, Est African American 04/04/2016 >89  >=60 mL/min Final  . GFR, Est Non African American 04/04/2016 85  >=60 mL/min Final     Imaging: No results found.  Speciality Comments: No specialty comments available.  Procedures:  No procedures performed Allergies: Penicillins   Assessment / Plan:     Visit Diagnoses: Psoriatic arthritis ([place]) - Severe aggressive disease. He still continues to have some DIP synovitis. He had done really well on Humira in the past but did not like the side effects and discontinued the medication he is on monotherapy with [name] right now. We had detailed discussion regarding different treatment options and side effects. After discussing indications side effects contraindications of different medications we decided to proceed with Cosyntex. Informed consent was obtained. We will apply for Cosyntex. I will also get a TB Gold  today. He will continue combination therapy initially. If he does really well we can discontinue [name] in future. Once approved he will be starting on Cosyntex 150 mg every week for 5 weeks and then every month after loading dose. We will check labs in a month and then every 3 months to monitor for drug toxicity.  Spondyloarthropathy [facility]): He is not having much spinal discomfort or SI joint pain. He's been very active.  High risk medication use - [name] 20 mg subcutaneous every week, folic acid 2 mg by mouth daily(Humira from January 2014 until April 2017) - Plan: Quantiferon tb gold assay (blood)   Dyslipidemia  Prosthetic eye globe - Left     Orders: Orders Placed This Encounter  Procedures  . Quantiferon tb gold assay (blood)  . IgG, IgA, IgM  . Serum protein electrophoresis with reflex   No orders of the defined types were placed in this encounter.   Face-to-face time spent with patient was 30 minutes. 50% of time was spent in counseling and coordination of care.  Follow-Up Instructions: Return in about 5 months (around 11/02/2016) for Psoriatic arthritis.   Bo Merino, MD  Note - This record has been created using Editor, commissioning.  Chart creation errors have been sought, but may not always  have been located. Such creation errors do not reflect on  the standard of medical care.

## 2016-05-29 DIAGNOSIS — Z970 Presence of artificial eye: Secondary | ICD-10-CM | POA: Insufficient documentation

## 2016-06-02 ENCOUNTER — Telehealth: Payer: Self-pay

## 2016-06-02 ENCOUNTER — Ambulatory Visit (INDEPENDENT_AMBULATORY_CARE_PROVIDER_SITE_OTHER): Payer: BLUE CROSS/BLUE SHIELD | Admitting: Rheumatology

## 2016-06-02 ENCOUNTER — Encounter: Payer: Self-pay | Admitting: Rheumatology

## 2016-06-02 VITALS — BP 120/64 | HR 72 | Resp 16 | Wt 183.0 lb

## 2016-06-02 DIAGNOSIS — Z970 Presence of artificial eye: Secondary | ICD-10-CM

## 2016-06-02 DIAGNOSIS — Z79899 Other long term (current) drug therapy: Secondary | ICD-10-CM

## 2016-06-02 DIAGNOSIS — L4050 Arthropathic psoriasis, unspecified: Secondary | ICD-10-CM

## 2016-06-02 DIAGNOSIS — M4690 Unspecified inflammatory spondylopathy, site unspecified: Secondary | ICD-10-CM

## 2016-06-02 DIAGNOSIS — Z9119 Patient's noncompliance with other medical treatment and regimen: Secondary | ICD-10-CM | POA: Diagnosis not present

## 2016-06-02 DIAGNOSIS — E785 Hyperlipidemia, unspecified: Secondary | ICD-10-CM | POA: Diagnosis not present

## 2016-06-02 DIAGNOSIS — M47819 Spondylosis without myelopathy or radiculopathy, site unspecified: Secondary | ICD-10-CM

## 2016-06-02 DIAGNOSIS — Z91199 Patient's noncompliance with other medical treatment and regimen due to unspecified reason: Secondary | ICD-10-CM

## 2016-06-02 NOTE — Patient Instructions (Signed)
Secukinumab injection What is this medicine? SECUKINUMAB (sek ue KIN ue mab) is used to treat psoriasis. It is also used to treat psoriatic arthritis and ankylosing spondylitis. This medicine may be used for other purposes; ask your health care provider or pharmacist if you have questions. COMMON BRAND NAME(S): Cosentyx What should I tell my health care provider before I take this medicine? They need to know if you have any of these conditions: -Crohn's disease, ulcerative colitis, or other inflammatory bowel disease -infection or history of infection -other conditions affecting the immune system -recently received or are scheduled to receive a vaccine -tuberculosis, a positive skin test for tuberculosis, or have recently been in close contact with someone who has tuberculosis -an unusual or allergic reaction to secukinumab, other medicines, latex, rubber, foods, dyes, or preservatives -pregnant or trying to get pregnant -breast-feeding How should I use this medicine? This medicine is for injection under the skin. It may be administered by a healthcare professional in a hospital or clinic setting or at home. If you get this medicine at home, you will be taught how to prepare and give this medicine. Use exactly as directed. Take your medicine at regular intervals. Do not take your medicine more often than directed. It is important that you put your used needles and syringes in a special sharps container. Do not put them in a trash can. If you do not have a sharps container, call your pharmacist or healthcare provider to get one. A special MedGuide will be given to you by the pharmacist with each prescription and refill. Be sure to read this information carefully each time. Talk to your pediatrician regarding the use of this medicine in children. Special care may be needed. Overdosage: If you think you have taken too much of this medicine contact a poison control center or emergency room at  once. NOTE: This medicine is only for you. Do not share this medicine with others. What if I miss a dose? It is important not to miss your dose. Call your doctor of health care professional if you are unable to keep an appointment. If you give yourself the medicine and you miss a dose, take it as soon as you can. If it is almost time for your next dose, take only that dose. Do not take double or extra doses. What may interact with this medicine? Do not take this medicine with any of the following medications: -live virus vaccines This medicine may also interact with the following medications: -cyclosporine -inactivated vaccines -warfarin This list may not describe all possible interactions. Give your health care provider a list of all the medicines, herbs, non-prescription drugs, or dietary supplements you use. Also tell them if you smoke, drink alcohol, or use illegal drugs. Some items may interact with your medicine. What should I watch for while using this medicine? Tell your doctor or healthcare professional if your symptoms do not start to get better or if they get worse. You will be tested for tuberculosis (TB) before you start this medicine. If your doctor prescribes any medicine for TB, you should start taking the TB medicine before starting this medicine. Make sure to finish the full course of TB medicine. Call your doctor or healthcare professional for advice if you get a fever, chills or sore throat, or other symptoms of a cold or flu. Do not treat yourself. This drug decreases your body's ability to fight infections. Try to avoid being around people who are sick. This medicine can decrease   the response to a vaccine. If you need to get vaccinated, tell your healthcare professional if you have received this medicine within the last 6 months. Extra booster doses may be needed. Talk to your doctor to see if a different vaccination schedule is needed. What side effects may I notice from  receiving this medicine? Side effects that you should report to your doctor or health care professional as soon as possible: -allergic reactions like skin rash, itching or hives, swelling of the face, lips, or tongue -signs and symptoms of infection like fever or chills; cough; sore throat; pain or trouble passing urine Side effects that usually do not require medical attention (report to your doctor or health care professional if they continue or are bothersome): -diarrhea This list may not describe all possible side effects. Call your doctor for medical advice about side effects. You may report side effects to FDA at 1-800-FDA-1088. Where should I keep my medicine? Keep out of the reach of children. Store the prefilled syringe or injection pen in a refrigerator between 2 to 8 degrees C (36 to 46 degrees F). Keep the syringe or the pen in the original carton until ready for use. Protect from light. Do not freeze. Do not shake. Prior to use, remove the syringe or pen from the refrigerator and use within 1 hour. Throw away any unused medicine after the expiration date on the label. NOTE: This sheet is a summary. It may not cover all possible information. If you have questions about this medicine, talk to your doctor, pharmacist, or health care provider.  2018 Elsevier/Gold Standard (2015-02-19 11:48:31)  

## 2016-06-02 NOTE — Telephone Encounter (Signed)
Submitted a prior authorization for Cosentyx through cover my meds. Will update once we receive a response.  Dema Timmons, [place], CPhT 1:41 PM

## 2016-06-02 NOTE — Progress Notes (Signed)
Pharmacy Note  Subjective:  Patient presents today to the [facility] to see Dr. Estanislado Pandy.  Patient was seen by the pharmacist for counseling on Cosentyx.  Objective:  CBC    Component Value Date/Time   WBC 4.3 04/04/2016 1132   RBC 4.68 04/04/2016 1132   HGB 13.9 04/04/2016 1132   HCT 42.8 04/04/2016 1132   PLT 256 04/04/2016 1132   MCV 91.5 04/04/2016 1132   MCH 29.7 04/04/2016 1132   MCHC 32.5 04/04/2016 1132   RDW 15.0 04/04/2016 1132   LYMPHSABS 1,677 04/04/2016 1132   MONOABS 430 04/04/2016 1132   EOSABS 43 04/04/2016 1132   BASOSABS 43 04/04/2016 1132   CMP     Component Value Date/Time   NA 138 04/04/2016 1132   K 5.0 04/04/2016 1132   CL 103 04/04/2016 1132   CO2 27 04/04/2016 1132   GLUCOSE 108 (H) 04/04/2016 1132   BUN 22 04/04/2016 1132   CREATININE 1.01 04/04/2016 1132   CALCIUM 9.2 04/04/2016 1132   PROT 6.5 04/04/2016 1132   ALBUMIN 4.3 04/04/2016 1132   AST 28 04/04/2016 1132   ALT 22 04/04/2016 1132   ALKPHOS 57 04/04/2016 1132   BILITOT 0.7 04/04/2016 1132   GFRNONAA 85 04/04/2016 1132   GFRAA >89 04/04/2016 1132   TB Gold: ordered today Hepatitis panel: negative (08/29/11) HIV: negative (11/24/10) SPEP: ordered today Immunoglobulin: ordered today  Assessment/Plan:  Counseled patient that Cosentyx is a IL-17 inhibitor that works to reduce pain and inflammation associated with arthritis.  Counseled patient on purpose, proper use, and adverse effects of Cosentyx. Reviewed the most common adverse effects of infection, inflammatory bowel disease, and allergic reaction.  Provided patient with medication education material and answered all questions.  Patient consented to Cosentyx.  Will upload consent into patine'ts chart.  Will apply for Cosentyx through patient's insurance.  Reviewed storage information for Cosentyx.  Advised patient that he will need a nursing visit for the initial Cosentyx injection.  Patient voiced understanding.      Elisabeth Most, Pharm.D., BCPS Clinical Pharmacist Pager: [phone] Phone: [phone] 06/02/2016 9:02 AM

## 2016-06-04 LAB — QUANTIFERON TB GOLD ASSAY (BLOOD)
Interferon Gamma Release Assay: NEGATIVE
Mitogen-Nil: 4.05 IU/mL
Quantiferon Nil Value: 0.05 IU/mL
Quantiferon Tb Ag Minus Nil Value: 0.01 IU/mL

## 2016-06-04 NOTE — Progress Notes (Signed)
TB gold neg

## 2016-06-06 ENCOUNTER — Telehealth: Payer: Self-pay | Admitting: Radiology

## 2016-06-06 ENCOUNTER — Telehealth: Payer: Self-pay | Admitting: Pharmacist

## 2016-06-06 ENCOUNTER — Telehealth: Payer: Self-pay

## 2016-06-06 LAB — PROTEIN ELECTROPHORESIS, SERUM, WITH REFLEX
ALBUMIN ELP: 4.6 g/dL (ref 3.8–4.8)
Alpha-1-Globulin: 0.3 g/dL (ref 0.2–0.3)
Alpha-2-Globulin: 0.6 g/dL (ref 0.5–0.9)
BETA 2: 0.3 g/dL (ref 0.2–0.5)
Beta Globulin: 0.4 g/dL (ref 0.4–0.6)
Gamma Globulin: 1 g/dL (ref 0.8–1.7)
TOTAL PROTEIN, SERUM ELECTROPHOR: 7.1 g/dL (ref 6.1–8.1)

## 2016-06-06 LAB — IGG, IGA, IGM
IGM, SERUM: 133 mg/dL (ref 48–271)
IgA: 145 mg/dL (ref 81–463)
IgG (Immunoglobin G), Serum: 1024 mg/dL (ref 694–1618)

## 2016-06-06 MED ORDER — SECUKINUMAB 150 MG/ML ~~LOC~~ SOAJ: 150.0000 mg | SUBCUTANEOUS | 0 refills | Status: DC

## 2016-06-06 NOTE — Telephone Encounter (Signed)
I have called patient to advise labs are normal  

## 2016-06-06 NOTE — Telephone Encounter (Signed)
Received a fax from [facility] requesting a prior authorization for [place]. Faxed completed form along with the last two office visit notes. Will update once we receive a response.  Reference number: [id number] Fax: [phone] Phone: [phone]  Will send documents to scan center  Sally-Ann Cutbirth, Sharyn Blitz, CPhT 10:20 AM

## 2016-06-06 NOTE — Progress Notes (Signed)
Labs are negative

## 2016-06-06 NOTE — Telephone Encounter (Signed)
Cosentyx was approved by patient's insurance from 06/02/2016 to 06/03/2018 (see attached).  Patient's prescription drug plan requires that this medication be filled through [facility].    Noted patient's baseline labs were normal.  I called patient to inform him of insurance approval.  Will send Cosentyx prescription and request initial prescription be delivered to the office for clinic administration.  I provided patient with the phone number to sign up for Cosentyx copay card.  Advised patient we will call to schedule nurse visit once medication is delivered to clinic.   Elisabeth Most, Pharm.D., BCPS, CPP Clinical Pharmacist Pager: [phone] Phone: [phone] 06/06/2016 3:50 PM

## 2016-06-09 ENCOUNTER — Telehealth: Payer: Self-pay

## 2016-06-09 NOTE — Telephone Encounter (Signed)
Spoke with Mimi from the pharmacy to check the status of patients Rx for Cosentyx loading dose. She states that the loading dose is still rejection. She verified with patient's insurance who also states that the maintenance dose Rx for Cosentyx has been approved but not the loading.  Spoke with Estill Bamberg for [employer] who confirms the loading dose will need authorization. She reprocessed the authorizatoin and got an approval for the loading dose. It has been approved through 07/14/16  Authorization number: [id number]   Demetrios Loll, CPhT 4:31 PM

## 2016-06-10 NOTE — Telephone Encounter (Signed)
Called Pharmacy to check status to patients Cosentyx Loading dose Rx. Spoke with Ivin Booty who states that a paid claim has been submitted to patient's insurance. His co-pay is $150 dollars. They will reach out to him today to gather his co pay assistance card and collect payment. After the payment has been made, they will contact the clinic to set up shipment.   Called patient to inform him of the update and give him the number to call the pharmacy. Left message for patient to call back.  Cerissa Zeiger, [place], CPhT 8:50 AM

## 2016-06-13 ENCOUNTER — Telehealth: Payer: Self-pay

## 2016-06-13 ENCOUNTER — Encounter: Payer: Self-pay | Admitting: Pharmacist

## 2016-06-13 NOTE — Telephone Encounter (Signed)
Nurse visit schedule for 06/22/16 at 2:00 pm

## 2016-06-13 NOTE — Telephone Encounter (Signed)
Spoke with Jacob Pena from [place] to check the status patients cosentyx loading dose Rx. He states that the pharmacy would like to set up shipment to the clinic for the patient's medication. Address was verified and is scheduled to be delivered to the clinic on 06/14/16.  We will need to schedule a nursing visit for his first dose. Can you contact him? Thank you!  Jacob Pena

## 2016-06-13 NOTE — Telephone Encounter (Signed)
I spoke to patient and informed him of the denial of [name].  Advised him we are working on appealing the denial and will continue to update him on outcome.  Patient confirms he has at least 4 [name] pens remaining at home at this time.  Patient denies any questions or concerns regarding his medications at this time.   Elisabeth Most, Pharm.D., BCPS, CPP Clinical Pharmacist Pager: [phone] Phone: [phone] 06/13/2016 4:19 PM

## 2016-06-13 NOTE — Telephone Encounter (Signed)
Called [facility] to check the status of patients [name] prior authorization. Spoke with [name] who states the the [name] has been denied. He will fax a copy of the denial letter to the clinic. Can we submit an appeal for this? Thank you  Will send documents to scan center.  [name], [place], CPhT 12:33 PM

## 2016-06-13 NOTE — Telephone Encounter (Signed)
ERROR

## 2016-06-14 NOTE — Progress Notes (Signed)
Appeal letter was signed by Dr. Estanislado Pandy and faxed to the appeals department.    Elisabeth Most, Pharm.D., BCPS, CPP Clinical Pharmacist Pager: [phone] Phone: [phone] 06/14/2016 11:03 AM

## 2016-06-22 ENCOUNTER — Ambulatory Visit (INDEPENDENT_AMBULATORY_CARE_PROVIDER_SITE_OTHER): Payer: BLUE CROSS/BLUE SHIELD | Admitting: *Deleted

## 2016-06-22 VITALS — BP 118/69 | HR 62

## 2016-06-22 DIAGNOSIS — L4050 Arthropathic psoriasis, unspecified: Secondary | ICD-10-CM | POA: Diagnosis not present

## 2016-06-22 MED ORDER — SECUKINUMAB 150 MG/ML ~~LOC~~ SOSY
150.0000 mg | PREFILLED_SYRINGE | Freq: Once | SUBCUTANEOUS | Status: AC
  Administered 2016-06-22: 150 mg via SUBCUTANEOUS

## 2016-06-22 NOTE — Progress Notes (Signed)
Patient in office for a new start to Cosentyx. Patient self injected Cosentyx to lower right abdomen. Patient tolerated injection well. Patient monitored in office or 30 minutes after administration for adverse reactions. NO adverse reactions noted.   Administrations This Visit    Secukinumab SOSY 150 mg    Admin Date 06/22/2016 Action Given Dose 150 mg Route Subcutaneous Administered By Carole Binning, LPN

## 2016-06-22 NOTE — Progress Notes (Signed)
Pharmacy Note  Subjective:   Patient is being initiated on Cosentyx.  Patient was previously counseled extensively on Cosentyx on 06/02/16 and consented to initiation of Cosentyx at that time.  Patient presents to clinic today to receive the first dose of Cosentyx.     Objective: CMP     Component Value Date/Time   NA 138 04/04/2016 1132   K 5.0 04/04/2016 1132   CL 103 04/04/2016 1132   CO2 27 04/04/2016 1132   GLUCOSE 108 (H) 04/04/2016 1132   BUN 22 04/04/2016 1132   CREATININE 1.01 04/04/2016 1132   CALCIUM 9.2 04/04/2016 1132   PROT 6.5 04/04/2016 1132   ALBUMIN 4.3 04/04/2016 1132   AST 28 04/04/2016 1132   ALT 22 04/04/2016 1132   ALKPHOS 57 04/04/2016 1132   BILITOT 0.7 04/04/2016 1132   GFRNONAA 85 04/04/2016 1132   GFRAA >89 04/04/2016 1132   CBC    Component Value Date/Time   WBC 4.3 04/04/2016 1132   RBC 4.68 04/04/2016 1132   HGB 13.9 04/04/2016 1132   HCT 42.8 04/04/2016 1132   PLT 256 04/04/2016 1132   MCV 91.5 04/04/2016 1132   MCH 29.7 04/04/2016 1132   MCHC 32.5 04/04/2016 1132   RDW 15.0 04/04/2016 1132   LYMPHSABS 1,677 04/04/2016 1132   MONOABS 430 04/04/2016 1132   EOSABS 43 04/04/2016 1132   BASOSABS 43 04/04/2016 1132   TB Gold: negative (06/02/16)  Assessment/Plan:  Patient was counseled on how to administer subcutaneous Cosentyx injection using a demonstration pen.  Patient received his first dose of Cosentyx today in the office.  Patient was monitored for 30 minutes post injection.  No injection site reaction noted.  Patient will need standing lab orders in one month.  Provided patient with standing lab instructions and placed standing lab order.    Advised patient that the appeal for [name] is still pending and may take up to 15 days.  It appears Rasuvo may be preferred.  I counseled patient on how to use Rasuvo and advised that if [name] if denied, we can try applying for Rasuvo.  Patient voiced understanding and denies any questions at this  time.   Patient scheduled for follow up on 11/07/16.    Elisabeth Most, Pharm.D., BCPS Clinical Pharmacist Pager: [phone] Phone: [phone] 06/22/2016 2:15 PM

## 2016-06-22 NOTE — Patient Instructions (Signed)
Standing Labs We placed an order today for your standing lab work.    Please come back and get your standing labs in 1 month   We have open lab Monday through Friday from 8:30-11:30 AM and 1:30-4 PM at the office of Dr. Tresa Moore, PA.   The office is located at [address] [place], [place], [place] [address] No appointment is necessary.   Labs are drawn by [facility].  You may receive a bill from [facility] for your lab work. If you have any questions regarding directions or hours of operation,  please call [phone].

## 2016-06-24 ENCOUNTER — Other Ambulatory Visit: Payer: Self-pay | Admitting: Rheumatology

## 2016-06-24 NOTE — Telephone Encounter (Signed)
ok 

## 2016-06-24 NOTE — Telephone Encounter (Signed)
Last Visit :06/02/16 Next Visit: 11/07/16 Labs: 04/04/16 WNL  Okay to refill [name]?

## 2016-06-29 NOTE — Telephone Encounter (Signed)
Received a fax from [place] regarding a prior authorization approval for [name] from 05/27/26 to 06/25/18.   Reference number: [facility] [id number] Phone [phone]  Will send document to scan center.  Spoke with patient to update him. He voices understanding and denies and questions at this time.   Holdan Stucke, [place], CPhT  12:10 PM

## 2016-07-06 ENCOUNTER — Other Ambulatory Visit: Payer: Self-pay | Admitting: Rheumatology

## 2016-07-06 ENCOUNTER — Telehealth: Payer: Self-pay

## 2016-07-06 NOTE — Telephone Encounter (Signed)
Received delivery of patient's [name] 20mg /04.ml (3 month supply) form [facility]. Medication has been stored in medicine cabinet.   Spoke to patient to let him know. The medication should have been delivered to his home. He will come to the clinic today to pick up.   Spoke with a representative from [facility] to let them know to mail him medication to the patient's home in the future.   Aireana Ryland, [place], CPhT

## 2016-07-10 ENCOUNTER — Other Ambulatory Visit: Payer: Self-pay | Admitting: Rheumatology

## 2016-07-12 ENCOUNTER — Other Ambulatory Visit: Payer: Self-pay

## 2016-07-12 NOTE — Telephone Encounter (Signed)
[facility] called concerning a Rx refill for Cosentyx sent to [phone] or called in at [phone].  Thank You.  Please Advise.

## 2016-07-13 ENCOUNTER — Telehealth: Payer: Self-pay | Admitting: Rheumatology

## 2016-07-13 NOTE — Telephone Encounter (Signed)
Alisha from [employer] called and is needing a verbal authorization to fill the patient's prescription.  CB#[phone].  Thank you.

## 2016-07-13 NOTE — Telephone Encounter (Signed)
06/22/16 last visit  11/07/16 next visit  TB gold negative 06/02/16 CBC and CMP due  Called patient to determine his lab plan  Left message for him to call us back

## 2016-07-14 NOTE — Telephone Encounter (Signed)
See previous phone note.  

## 2016-07-15 MED ORDER — SECUKINUMAB 150 MG/ML ~~LOC~~ SOAJ: 150.0000 mg | SUBCUTANEOUS | 0 refills | Status: DC

## 2016-07-15 NOTE — Telephone Encounter (Signed)
ok 

## 2016-07-15 NOTE — Addendum Note (Signed)
Addended by: Carole Binning on: 07/15/2016 09:51 AM   Modules accepted: Orders

## 2016-07-15 NOTE — Telephone Encounter (Signed)
06/22/16 last visit  11/07/16 next visit  TB gold negative 06/02/16 CBC and CMP due  Patient coming 07/18/16 to update labs  Okay to refill Cosentyx?

## 2016-07-18 ENCOUNTER — Other Ambulatory Visit: Payer: Self-pay

## 2016-07-18 DIAGNOSIS — Z79899 Other long term (current) drug therapy: Secondary | ICD-10-CM | POA: Diagnosis not present

## 2016-07-18 LAB — CBC WITH DIFFERENTIAL/PLATELET
BASOS PCT: 0 %
Basophils Absolute: 0 cells/uL (ref 0–200)
EOS PCT: 2 %
Eosinophils Absolute: 72 cells/uL (ref 15–500)
HEMATOCRIT: 41.6 % (ref 38.5–50.0)
HEMOGLOBIN: 13.6 g/dL (ref 13.2–17.1)
LYMPHS ABS: 1332 {cells}/uL (ref 850–3900)
Lymphocytes Relative: 37 %
MCH: 30.2 pg (ref 27.0–33.0)
MCHC: 32.7 g/dL (ref 32.0–36.0)
MCV: 92.2 fL (ref 80.0–100.0)
MONO ABS: 324 {cells}/uL (ref 200–950)
MPV: 9.2 fL (ref 7.5–12.5)
Monocytes Relative: 9 %
NEUTROS ABS: 1872 {cells}/uL (ref 1500–7800)
NEUTROS PCT: 52 %
Platelets: 242 10*3/uL (ref 140–400)
RBC: 4.51 MIL/uL (ref 4.20–5.80)
RDW: 14.5 % (ref 11.0–15.0)
WBC: 3.6 10*3/uL — AB (ref 3.8–10.8)

## 2016-07-19 LAB — COMPLETE METABOLIC PANEL WITH GFR
ALBUMIN: 4.2 g/dL (ref 3.6–5.1)
ALK PHOS: 51 U/L (ref 40–115)
ALT: 18 U/L (ref 9–46)
AST: 25 U/L (ref 10–35)
BUN: 23 mg/dL (ref 7–25)
CALCIUM: 8.7 mg/dL (ref 8.6–10.3)
CO2: 20 mmol/L (ref 20–31)
CREATININE: 1.03 mg/dL (ref 0.70–1.33)
Chloride: 103 mmol/L (ref 98–110)
GFR, Est African American: >89 mL/min (ref >=60)
GFR, Est Non African American: 82 mL/min (ref >=60)
GLUCOSE: 103 mg/dL — AB (ref 65–99)
POTASSIUM: 4.5 mmol/L (ref 3.5–5.3)
SODIUM: 136 mmol/L (ref 135–146)
TOTAL PROTEIN: 6.3 g/dL (ref 6.1–8.1)
Total Bilirubin: 0.8 mg/dL (ref 0.2–1.2)

## 2016-07-19 NOTE — Progress Notes (Signed)
Mild decrease in WBCs. We'll continue to monitor.

## 2016-09-15 ENCOUNTER — Other Ambulatory Visit: Payer: Self-pay | Admitting: Rheumatology

## 2016-09-15 NOTE — Telephone Encounter (Signed)
06/02/16 last visit 11/02/16 next visit  CBC Latest Ref Rng & Units 07/18/2016 04/04/2016 05/18/2011  WBC 3.8 - 10.8 K/uL 3.6(L) 4.3 4.7  Hemoglobin 13.2 - 17.1 g/dL 13.6 13.9 13.9  Hematocrit 38.5 - 50.0 % 41.6 42.8 42.3  Platelets 140 - 400 K/uL 242 256 271   CMP Latest Ref Rng & Units 07/18/2016 04/04/2016 05/18/2011  Glucose 65 - 99 mg/dL 103(H) 108(H) 96  BUN 7 - 25 mg/dL 23 22 15   Creatinine 0.70 - 1.33 mg/dL 1.03 1.01 0.95  Sodium 135 - 146 mmol/L 136 138 135  Potassium 3.5 - 5.3 mmol/L 4.5 5.0 4.5  Chloride 98 - 110 mmol/L 103 103 102  CO2 20 - 31 mmol/L 20 27 23   Calcium 8.6 - 10.3 mg/dL 8.7 9.2 8.7  Total Protein 6.1 - 8.1 g/dL 6.3 6.5 6.5  Total Bilirubin 0.2 - 1.2 mg/dL 0.8 0.7 0.5  Alkaline Phos 40 - 115 U/L 51 57 58  AST 10 - 35 U/L 25 28 21   ALT 9 - 46 U/L 18 22 19    Ok to refill per Dr Estanislado Pandy

## 2016-10-06 ENCOUNTER — Other Ambulatory Visit: Payer: Self-pay | Admitting: Rheumatology

## 2016-10-06 NOTE — Telephone Encounter (Signed)
06/02/16 last visit 11/02/16 next visit Labs: 07/18/16  Mild decrease in WBCs. We'll continue to monitor.         TB Gold: 06/02/16 Neg  Okay to refill per Dr. Estanislado Pandy

## 2016-10-30 NOTE — Progress Notes (Signed)
Office Visit Note  Patient: Jacob Pena             Date of Birth: October 23, 1960           MRN: [id number]             PCP: Denita Lung, MD Referring: Denita Lung, MD Visit Date: 11/07/2016 Occupation: @GUAROCC @    Subjective:  Joint pain.   History of Present Illness: Jacob Pena is a 56 y.o. male with history of psoriatic arthritis he was a started on Cosyntex 150 mg subcutaneous every month in May 2018. He believes the Cosyntex is been working well. He is still have some stiffness in his right second finger DIP. Besides that he does not have any joint swelling. He's been working out and that causes his joints to be sore and stiff.  Activities of Daily Living:  Patient reports morning stiffness for 0 minute.   Patient Denies nocturnal pain.  Difficulty dressing/grooming: Denies Difficulty climbing stairs: Denies Difficulty getting out of chair: Denies Difficulty using hands for taps, buttons, cutlery, and/or writing: Denies   Review of Systems  Constitutional: Negative for fatigue.  HENT: Negative for mouth dryness.   Eyes: Negative for dryness.  Respiratory: Negative for shortness of breath.   Cardiovascular: Negative for swelling in legs/feet.  Gastrointestinal: Negative for constipation.  Endocrine: Negative for increased urination.  Genitourinary: Negative for painful urination.  Musculoskeletal: Positive for arthralgias, joint pain, joint swelling and muscle weakness.  Skin: Negative for rash.  Neurological: Negative for numbness.  Hematological: Negative for bruising/bleeding tendency.  Psychiatric/Behavioral: Negative for sleep disturbance.    PMFS History:  Patient Active Problem List   Diagnosis Date Noted  . 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 Psoriatic arthritis ([place]) 01/08/2016  . Spondyloarthropathy ([place]) 01/08/2016  . High risk medication use 01/08/2016  . Familial hypercholesterolemia 05/18/2011  . Dyslipidemia 10/07/2008    Past Medical  History:  Diagnosis Date  . Anxiety   . Depression   . Rheumatoid arthritis ([place])     Family History  Problem Relation Age of Onset  . Colon cancer Paternal Grandmother   . Diverticulitis Mother   . Breast cancer Mother   . Prostate cancer Father    Past Surgical History:  Procedure Laterality Date  . APPENDECTOMY    . EYE SURGERY     left eye/prosthetic eye  . STOMACH SURGERY     floating ligament  . TONSILLECTOMY     Social History   Social History Narrative  . No narrative on file     Objective: Vital Signs: BP 122/61 (BP Location: Left Arm, Patient Position: Sitting, Cuff Size: Normal)   Pulse (!) 58   Resp 14   Ht 6\' 2"  (1.88 m)   Wt 186 lb 8 oz (84.6 kg)   BMI 23.95 kg/m    Physical Exam   Musculoskeletal Exam: C-spine and thoracic lumbar spine good range of motion. Shoulder joints elbow joints wrist joints are good range of motion. He has synovitis over his right second and fifth finger DIP joints. Hip joints knee joints ankles MTPs PIPs with good range of motion. He has tenderness over left trochanteric bursa consistent with trochanteric bursitis.  CDAI Exam: CDAI Homunculus Exam:   Tenderness:  Right hand: 2nd DIP and 5th DIP  Swelling:  Right hand: 2nd DIP and 5th DIP  Joint Counts:  CDAI Tender Joint count: 0 CDAI Swollen Joint count: 0  Global Assessments:  Patient Global Assessment: 3  Provider Global Assessment: 3  CDAI Calculated Score: 6   Investigation: No additional findings.TB Gold: negative in 05/2016 CBC Latest Ref Rng & Units 07/18/2016 04/04/2016 05/18/2011  WBC 3.8 - 10.8 K/uL 3.6(L) 4.3 4.7  Hemoglobin 13.2 - 17.1 g/dL 13.6 13.9 13.9  Hematocrit 38.5 - 50.0 % 41.6 42.8 42.3  Platelets 140 - 400 K/uL 242 256 271   CMP Latest Ref Rng & Units 07/18/2016 04/04/2016 05/18/2011  Glucose 65 - 99 mg/dL 103(H) 108(H) 96  BUN 7 - 25 mg/dL 23 22 15   Creatinine 0.70 - 1.33 mg/dL 1.03 1.01 0.95  Sodium 135 - 146 mmol/L 136 138 135    Potassium 3.5 - 5.3 mmol/L 4.5 5.0 4.5  Chloride 98 - 110 mmol/L 103 103 102  CO2 20 - 31 mmol/L 20 27 23   Calcium 8.6 - 10.3 mg/dL 8.7 9.2 8.7  Total Protein 6.1 - 8.1 g/dL 6.3 6.5 6.5  Total Bilirubin 0.2 - 1.2 mg/dL 0.8 0.7 0.5  Alkaline Phos 40 - 115 U/L 51 57 58  AST 10 - 35 U/L 25 28 21   ALT 9 - 46 U/L 18 22 19     Imaging: No results found.  Speciality Comments: No specialty comments available.    Procedures:  No procedures performed Allergies: Penicillins   Assessment / Plan:     Visit Diagnoses: Psoriatic arthritis ([place]) - Severe aggressive disease. He will continue combination therapy initially. . On Cosyntex 150 mg subcutaneous every month he has improvement in his symptoms but he still have some synovitis over his DIPs. I discussed increasing dose to 300 mg subcutaneous every month. He was in agreement. I will give him one sample injection today so he can increase his dose. I will also apply for Cosyntex tree 100 mg subcutaneous every month. I will check labs today and then in one month after the increased dose and then every 3 months to monitor for drug toxicity.  Trochanteric bursitis of left hip: ITB and exercise were demonstrated and discussed in the office today and also a handout was given.  High risk medication use - [name] 20 mg subcutaneous every week, folic acid 2 mg by mouth daily, Cosentyx 150 mg subcutaneous every month (May 2018) (Humira caused side effects) - Plan: CBC with Differential/Platelet, COMPLETE METABOLIC PANEL WITH GFR today and  Dyslipidemia  Prosthetic eye globe - left    Orders: No orders of the defined types were placed in this encounter.  No orders of the defined types were placed in this encounter.     Follow-Up Instructions: Return in about 5 months (around 04/07/2017) for Psoriatic arthritis.   Bo Merino, MD  Note - This record has been created using Editor, commissioning.  Chart creation errors have been sought, but may  not always  have been located. Such creation errors do not reflect on  the standard of medical care.

## 2016-11-02 ENCOUNTER — Ambulatory Visit: Payer: BLUE CROSS/BLUE SHIELD | Admitting: Rheumatology

## 2016-11-07 ENCOUNTER — Encounter: Payer: Self-pay | Admitting: Rheumatology

## 2016-11-07 ENCOUNTER — Ambulatory Visit (INDEPENDENT_AMBULATORY_CARE_PROVIDER_SITE_OTHER): Payer: BLUE CROSS/BLUE SHIELD | Admitting: Rheumatology

## 2016-11-07 ENCOUNTER — Telehealth: Payer: Self-pay

## 2016-11-07 VITALS — BP 122/61 | HR 58 | Resp 14 | Ht 74.0 in | Wt 186.5 lb

## 2016-11-07 DIAGNOSIS — Z970 Presence of artificial eye: Secondary | ICD-10-CM

## 2016-11-07 DIAGNOSIS — L4050 Arthropathic psoriasis, unspecified: Secondary | ICD-10-CM | POA: Diagnosis not present

## 2016-11-07 DIAGNOSIS — E785 Hyperlipidemia, unspecified: Secondary | ICD-10-CM

## 2016-11-07 DIAGNOSIS — Z79899 Other long term (current) drug therapy: Secondary | ICD-10-CM | POA: Diagnosis not present

## 2016-11-07 DIAGNOSIS — M7062 Trochanteric bursitis, left hip: Secondary | ICD-10-CM

## 2016-11-07 MED ORDER — SECUKINUMAB 150 MG/ML ~~LOC~~ SOAJ: 300.0000 mg | SUBCUTANEOUS | 0 refills | Status: DC

## 2016-11-07 NOTE — Telephone Encounter (Signed)
Patient Cosnetyx does has increased from 150 to 300mg s monthly. Called [facility] to verify if an authorization is required. Patient has a current authorization on file for Cosentyx rom 06/02/2016 through 06/03/2018. Spoke to [employer] who states that pt will not need a new authorization. The current authorization will cover the increase in dosage.   Reference number: [id number] Phone number: [phone]  Called patient to inform him. He has enough medication for October and November. Patient will need a new rx sent to his pharmacy with new directions starting in December 2018.   Liridona Mashaw, [place], CPhT 2:17 PM

## 2016-11-07 NOTE — Telephone Encounter (Signed)
Notified patient.

## 2016-11-07 NOTE — Patient Instructions (Addendum)
Standing Labs We placed an order today for your standing lab work.    Please come back and get your standing labs in November and every 3 months Iliotibial Bursitis Rehab Ask your health care provider which exercises are safe for you. Do exercises exactly as told by your health care provider and adjust them as directed. It is normal to feel mild stretching, pulling, tightness, or discomfort as you do these exercises, but you should stop right away if you feel sudden pain or your pain gets worse.Do not begin these exercises until told by your health care provider. Stretching and range of motion exercises These exercises warm up your muscles and joints and improve the movement and flexibility of your leg. These exercises also help to relieve pain and stiffness. Exercise A: Quadriceps stretch, prone  1. Lie on your abdomen on a firm surface, such as a bed or padded floor. 2. Bend your left / right knee and hold your ankle. If you cannot reach your ankle or pant leg, loop a belt around your foot and grab the belt instead. 3. Gently pull your heel toward your buttocks. Your knee should not slide out to the side. You should feel a stretch in the front of your thigh and knee. 4. Hold this position for __________ seconds. Repeat __________ times. Complete this exercise __________ times a day. Exercise B: Lunge ( adductor stretch) 1. Stand and spread your legs about 3 feet (about 1 m) apart. Put your left / right leg slightly back for balance. 2. Lean away from your left / right leg by bending your other knee and shifting your weight toward your bent knee. You may rest your hands on your thigh for balance. You should feel a stretch in your left / right inner thigh. 3. Hold for __________ seconds. Repeat __________ times. Complete this exercise __________ times a day. Exercise C: Hamstring stretch, supine  1. Lie on your back. 2. Hold both ends of a belt or towel as you loop it over the ball of your  left / right foot. The ball of your foot is on the walking surface, right under your toes. 3. Straighten your left / right knee and slowly pull on the belt to raise your leg. Stop when you feel a gentle stretch in the back of your left / right knee or thigh. ? Do not let your left / right knee bend. ? Keep your other leg flat on the floor. 4. Hold this position for __________ seconds. Repeat __________ times. Complete this exercise __________ times a day. Strengthening exercises These exercises build strength and endurance in your leg. Endurance is the ability to use your muscles for a long time, even after they get tired. Exercise D: Quadriceps wall slides  1. Lean your back against a smooth wall or door while you walk your feet out 18-24 inches (46-61 cm) from it. 2. Place your feet hip-width apart. 3. Slowly slide down the wall or door until your knees bend as far as told by your health care provider. Keep your knees over your heels, not your toes. Keep your knees in line with your hips. 4. Hold for __________ seconds. 5. Push through your heels to stand up to rest for __________ seconds after each repetition. Repeat __________ times. Complete this exercise __________ times a day. Exercise E: Straight leg raises ( hip abductors) 1. Lie on your side, with your left / right leg in the top position. Lie so your head, shoulder, knee,  and hip line up with each other. You may bend your bottom knee to help you balance. 2. Lift your top leg 4-6 inches (10-15 cm) while keeping your toes pointed straight ahead. 3. Hold this position for __________ seconds. 4. Slowly lower your leg to the starting position. Allow your muscles to relax completely after each repetition. Repeat __________ times. Complete this exercise __________ times a day. Exercise F: Straight leg raises ( hip extensors) 1. Lie on your abdomen on a firm surface. You can put a pillow under your hips if that is more  comfortable. 2. Tense the muscles in your buttocks and lift your left / right leg about 4-6 inches (10-15 cm). Keep your knee straight as you lift your leg. 3. Hold this position for __________ seconds. 4. Slowly lower your leg to the starting position. 5. Let your leg relax completely after each repetition. Repeat __________ times. Complete this exercise __________ times a day. Exercise G: Bridge ( hip extensors) 1. Lie on your back on a firm surface with your knees bent and your feet flat on the floor. 2. Tighten your buttocks muscles and lift your bottom off the floor until your trunk is level with your thighs. ? Do not arch your back. ? You should feel the muscles working in your buttocks and the back of your thighs. If you do not feel these muscles, slide your feet 1-2 inches (2.5-5 cm) farther away from your buttocks. 3. Hold this position for __________ seconds. 4. Slowly lower your hips to the starting position. 5. Let your buttocks muscles relax completely between repetitions. 6. If this exercise is too easy, try doing it with your arms crossed over your chest. Repeat __________ times. Complete this exercise __________ times a day. This information is not intended to replace advice given to you by your health care provider. Make sure you discuss any questions you have with your health care provider. Document Released: 01/17/2005 Document Revised: 09/24/2015 Document Reviewed: 12/30/2014 Elsevier Interactive Patient Education  [employer].   We have open lab Monday through Friday from 8:30-11:30 AM and 1:30-4 PM at the office of Dr. Bo Merino.   The office is located at [address] [place], [place], [place] [address] No appointment is necessary.   Labs are drawn by [facility].  You may receive a bill from [facility] for your lab work. If you have any questions regarding directions or hours of operation,  please call [phone].

## 2016-11-07 NOTE — Telephone Encounter (Signed)
Prescription sent to the pharmacy.

## 2016-11-08 LAB — COMPLETE METABOLIC PANEL WITH GFR
AG RATIO: 1.7 (calc) (ref 1.0–2.5)
ALBUMIN MSPROF: 4.2 g/dL (ref 3.6–5.1)
ALT: 21 U/L (ref 9–46)
AST: 25 U/L (ref 10–35)
Alkaline phosphatase (APISO): 54 U/L (ref 40–115)
BILIRUBIN TOTAL: 0.7 mg/dL (ref 0.2–1.2)
BUN: 21 mg/dL (ref 7–25)
CALCIUM: 9 mg/dL (ref 8.6–10.3)
CHLORIDE: 104 mmol/L (ref 98–110)
CO2: 27 mmol/L (ref 20–32)
Creat: 1.08 mg/dL (ref 0.70–1.33)
GFR, EST AFRICAN AMERICAN: 90 mL/min/{1.73_m2} (ref >=60)
GFR, EST NON AFRICAN AMERICAN: 77 mL/min/{1.73_m2} (ref >=60)
Globulin: 2.5 g/dL (calc) (ref 1.9–3.7)
Glucose, Bld: 100 mg/dL — ABNORMAL HIGH (ref 65–99)
POTASSIUM: 4.8 mmol/L (ref 3.5–5.3)
Sodium: 140 mmol/L (ref 135–146)
TOTAL PROTEIN: 6.7 g/dL (ref 6.1–8.1)

## 2016-11-08 LAB — CBC WITH DIFFERENTIAL/PLATELET
BASOS PCT: 0.7 %
Basophils Absolute: 29 cells/uL (ref 0–200)
EOS PCT: 1.4 %
Eosinophils Absolute: 59 cells/uL (ref 15–500)
HEMATOCRIT: 43.1 % (ref 38.5–50.0)
Hemoglobin: 14.7 g/dL (ref 13.2–17.1)
Lymphs Abs: 1340 cells/uL (ref 850–3900)
MCH: 31.5 pg (ref 27.0–33.0)
MCHC: 34.1 g/dL (ref 32.0–36.0)
MCV: 92.3 fL (ref 80.0–100.0)
MPV: 10 fL (ref 7.5–12.5)
Monocytes Relative: 8.3 %
NEUTROS ABS: 2423 {cells}/uL (ref 1500–7800)
NEUTROS PCT: 57.7 %
Platelets: 240 10*3/uL (ref 140–400)
RBC: 4.67 10*6/uL (ref 4.20–5.80)
RDW: 13.1 % (ref 11.0–15.0)
Total Lymphocyte: 31.9 %
WBC: 4.2 10*3/uL (ref 3.8–10.8)
WBCMIX: 349 {cells}/uL (ref 200–950)

## 2016-11-08 NOTE — Progress Notes (Signed)
wnls

## 2016-12-13 ENCOUNTER — Other Ambulatory Visit: Payer: Self-pay

## 2016-12-13 DIAGNOSIS — Z79899 Other long term (current) drug therapy: Secondary | ICD-10-CM

## 2016-12-13 LAB — COMPLETE METABOLIC PANEL WITH GFR
AG RATIO: 1.9 (calc) (ref 1.0–2.5)
ALT: 17 U/L (ref 9–46)
AST: 23 U/L (ref 10–35)
Albumin: 4.3 g/dL (ref 3.6–5.1)
Alkaline phosphatase (APISO): 54 U/L (ref 40–115)
BILIRUBIN TOTAL: 0.4 mg/dL (ref 0.2–1.2)
BUN/Creatinine Ratio: 26 (calc) — ABNORMAL HIGH (ref 6–22)
BUN: 27 mg/dL — ABNORMAL HIGH (ref 7–25)
CALCIUM: 8.9 mg/dL (ref 8.6–10.3)
CHLORIDE: 100 mmol/L (ref 98–110)
CO2: 28 mmol/L (ref 20–32)
Creat: 1.05 mg/dL (ref 0.70–1.33)
GFR, EST AFRICAN AMERICAN: 93 mL/min/{1.73_m2} (ref >=60)
GFR, EST NON AFRICAN AMERICAN: 80 mL/min/{1.73_m2} (ref >=60)
GLOBULIN: 2.3 g/dL (ref 1.9–3.7)
Glucose, Bld: 90 mg/dL (ref 65–99)
POTASSIUM: 4.5 mmol/L (ref 3.5–5.3)
SODIUM: 135 mmol/L (ref 135–146)
TOTAL PROTEIN: 6.6 g/dL (ref 6.1–8.1)

## 2016-12-13 LAB — CBC WITH DIFFERENTIAL/PLATELET
BASOS ABS: 23 {cells}/uL (ref 0–200)
Basophils Relative: 0.4 %
EOS ABS: 40 {cells}/uL (ref 15–500)
EOS PCT: 0.7 %
HCT: 41.5 % (ref 38.5–50.0)
Hemoglobin: 14 g/dL (ref 13.2–17.1)
Lymphs Abs: 1727 cells/uL (ref 850–3900)
MCH: 30.7 pg (ref 27.0–33.0)
MCHC: 33.7 g/dL (ref 32.0–36.0)
MCV: 91 fL (ref 80.0–100.0)
MONOS PCT: 7.4 %
MPV: 10.4 fL (ref 7.5–12.5)
NEUTROS PCT: 61.2 %
Neutro Abs: 3488 cells/uL (ref 1500–7800)
PLATELETS: 238 10*3/uL (ref 140–400)
RBC: 4.56 10*6/uL (ref 4.20–5.80)
RDW: 12.7 % (ref 11.0–15.0)
TOTAL LYMPHOCYTE: 30.3 %
WBC mixed population: 422 cells/uL (ref 200–950)
WBC: 5.7 10*3/uL (ref 3.8–10.8)

## 2016-12-14 NOTE — Progress Notes (Signed)
Within normal limits

## 2017-01-02 ENCOUNTER — Other Ambulatory Visit: Payer: Self-pay | Admitting: Rheumatology

## 2017-01-02 NOTE — Telephone Encounter (Signed)
Last Visit: 11/07/16 Next Visit: 04/10/17 Labs: 12/13/16 WNL  Okay to refill per Dr. Estanislado Pandy

## 2017-01-10 ENCOUNTER — Other Ambulatory Visit: Payer: Self-pay | Admitting: Rheumatology

## 2017-01-11 ENCOUNTER — Other Ambulatory Visit: Payer: Self-pay | Admitting: Rheumatology

## 2017-01-11 NOTE — Telephone Encounter (Signed)
Last Visit: 11/07/16 Next Visit: 04/10/17  Okay to refill per Dr. Estanislado Pandy

## 2017-02-23 ENCOUNTER — Other Ambulatory Visit: Payer: Self-pay | Admitting: Rheumatology

## 2017-02-24 NOTE — Telephone Encounter (Signed)
Last Visit: 11/07/16 Next Visit: 04/10/17 Labs: 12/13/16 WNL  Okay to refill per Dr. Estanislado Pandy

## 2017-03-16 DIAGNOSIS — M5126 Other intervertebral disc displacement, lumbar region: Secondary | ICD-10-CM | POA: Diagnosis not present

## 2017-03-16 DIAGNOSIS — M545 Low back pain: Secondary | ICD-10-CM | POA: Diagnosis not present

## 2017-03-22 DIAGNOSIS — M5126 Other intervertebral disc displacement, lumbar region: Secondary | ICD-10-CM | POA: Diagnosis not present

## 2017-03-22 DIAGNOSIS — M545 Low back pain: Secondary | ICD-10-CM | POA: Diagnosis not present

## 2017-03-27 DIAGNOSIS — M5126 Other intervertebral disc displacement, lumbar region: Secondary | ICD-10-CM | POA: Diagnosis not present

## 2017-03-27 DIAGNOSIS — M545 Low back pain: Secondary | ICD-10-CM | POA: Diagnosis not present

## 2017-03-27 NOTE — Progress Notes (Signed)
Office Visit Note  Patient: Jacob Pena             Date of Birth: 1960-06-12           MRN: [id number]             PCP: Denita Lung, MD Referring: Denita Lung, MD Visit Date: 04/10/2017 Occupation: @GUAROCC @    Subjective:  Right 1st DIP joint pain    History of Present Illness: Jacob Pena is a 57 y.o. male with history of psoriatic arthritis.  Patient states he continues to use 150 mg subcu days every month [name] 20 mg subcu is every week and folic acid 2 mg daily.  He states he has not noticed any change since starting Cosentyx.  He states he continues to have pain and swelling in his right first DIP joint.  He states at his last visit he was having pain in his fifth digit which has since improved.  He continues to have psoriasis on his scalp.  He currently has 2 small patches.  He denies any SI joint pain, Achilles tendinitis, or plantar fasciitis.  He states while working out he hurt his back.  He has been alternating heat and ice and going to physical therapy.  He states after resting for about 1 month and not going to the gym he has noticed improvement in his lower back pain.  States he also has been taking natural anti-inflammatories daily.    Activities of Daily Living:  Patient reports morning stiffness for 5 minutes.   Patient Reports nocturnal pain.  Difficulty dressing/grooming: Denies Difficulty climbing stairs: Denies Difficulty getting out of chair: Denies Difficulty using hands for taps, buttons, cutlery, and/or writing: Reports   Review of Systems  Constitutional: Negative for fatigue, night sweats and weakness.  HENT: Negative for mouth sores, mouth dryness and nose dryness.   Eyes: Negative for redness and dryness.  Respiratory: Negative for shortness of breath and difficulty breathing.   Cardiovascular: Negative for chest pain, palpitations, hypertension, irregular heartbeat and swelling in legs/feet.  Gastrointestinal: Negative for abdominal  pain, constipation and diarrhea.  Endocrine: Negative for increased urination.  Genitourinary: Negative for painful urination.  Musculoskeletal: Positive for morning stiffness. Negative for arthralgias, joint pain, joint swelling, myalgias, muscle weakness, muscle tenderness and myalgias.  Skin: Negative for color change, rash, hair loss, nodules/bumps, skin tightness, ulcers and sensitivity to sunlight.  Allergic/Immunologic: Negative for susceptible to infections.  Neurological: Negative for dizziness, fainting, headaches, memory loss and night sweats.  Hematological: Negative for bruising/bleeding tendency and swollen glands.  Psychiatric/Behavioral: Negative for depressed mood, confusion and sleep disturbance. The patient is not nervous/anxious.     PMFS History:  Patient Active Problem List   Diagnosis Date Noted  . 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 Psoriatic arthritis ([place]) 01/08/2016  . Spondyloarthropathy ([place]) 01/08/2016  . High risk medication use 01/08/2016  . Familial hypercholesterolemia 05/18/2011  . Dyslipidemia 10/07/2008    Past Medical History:  Diagnosis Date  . Anxiety   . Depression   . Rheumatoid arthritis ([place])     Family History  Problem Relation Age of Onset  . Colon cancer Paternal Grandmother   . Diverticulitis Mother   . Breast cancer Mother   . Prostate cancer Father    Past Surgical History:  Procedure Laterality Date  . APPENDECTOMY    . EYE SURGERY     left eye/prosthetic eye  . STOMACH SURGERY     floating ligament  . TONSILLECTOMY  Social History   Social History Narrative  . Not on file     Objective: Vital Signs: BP (!) 143/79 (BP Location: Left Arm, Patient Position: Sitting, Cuff Size: Normal)   Pulse (!) 59   Resp 17   Ht 6' 2.5" (1.892 m)   Wt 186 lb (84.4 kg)   BMI 23.56 kg/m    Physical Exam  Constitutional: He is oriented to person, place, and time. He appears well-developed and well-nourished.  HENT:  Head:  Normocephalic and atraumatic.  Eyes: Conjunctivae and EOM are normal. Pupils are equal, round, and reactive to light.  Neck: Normal range of motion. Neck supple.  Cardiovascular: Normal rate, regular rhythm and normal heart sounds.  Pulmonary/Chest: Effort normal and breath sounds normal.  Abdominal: Soft. Bowel sounds are normal.  Neurological: He is alert and oriented to person, place, and time.  Skin: Skin is warm and dry. Capillary refill takes less than 2 seconds.  2 small patches of psoriasis on scalp  Psychiatric: He has a normal mood and affect. His behavior is normal.  Nursing note and vitals reviewed.    Musculoskeletal Exam: C-spine, thoracic spine, lumbar spine good ROM.  No midline spinal tenderness.  No SI joint tenderness.  Shoulder joints, elbow joints, wrist joints, MCPs, PIPs, and DIPs good ROM.  He has synovitis of his right 1st DIP joint. He has DIP synovial thickening. Hip joints, knee joints, ankle joints, MTPs, PIPs, DIPs good range of motion with no synovitis. No warmth or effusion of knees.  No knee crepitus.  No tenderness of trochanteric bursa.    CDAI Exam: CDAI Homunculus Exam:   Tenderness:  Right hand: 2nd DIP  Swelling:  Right hand: 2nd DIP  Joint Counts:  CDAI Tender Joint count: 0 CDAI Swollen Joint count: 0     Investigation: No additional findings.TB Gold: 06/02/2016 Negative  CBC Latest Ref Rng & Units 12/13/2016 11/07/2016 07/18/2016  WBC 3.8 - 10.8 Thousand/uL 5.7 4.2 3.6(L)  Hemoglobin 13.2 - 17.1 g/dL 14.0 14.7 13.6  Hematocrit 38.5 - 50.0 % 41.5 43.1 41.6  Platelets 140 - 400 Thousand/uL 238 240 242   CMP Latest Ref Rng & Units 12/13/2016 11/07/2016 07/18/2016  Glucose 65 - 99 mg/dL 90 100(H) 103(H)  BUN 7 - 25 mg/dL 27(H) 21 23  Creatinine 0.70 - 1.33 mg/dL 1.05 1.08 1.03  Sodium 135 - 146 mmol/L 135 140 136  Potassium 3.5 - 5.3 mmol/L 4.5 4.8 4.5  Chloride 98 - 110 mmol/L 100 104 103  CO2 20 - 32 mmol/L 28 27 20   Calcium 8.6 - 10.3  mg/dL 8.9 9.0 8.7  Total Protein 6.1 - 8.1 g/dL 6.6 6.7 6.3  Total Bilirubin 0.2 - 1.2 mg/dL 0.4 0.7 0.8  Alkaline Phos 40 - 115 U/L - - 51  AST 10 - 35 U/L 23 25 25   ALT 9 - 46 U/L 17 21 18     Imaging: Xr Hand 2 View Left  Result Date: 04/10/2017 Severe DIP narrowing was noted in first second third and fourth DIPs with erosive changes.  PIP narrowing was noted.  CMC narrowing was noted.  No MCP narrowing was noted.  Patient was noted compared to his films obtained on October 09, 2014. Impression: These findings are consistent with psoriatic arthritis.  Xr Hand 2 View Right  Result Date: 04/10/2017 Severe narrowing of all digits was noted.  Erosive changes were noted in the first and fourth DIP joints.  All PIP joint narrowing was noted.  Soft  tissue swelling of the first DIP joint was noted.  No MCP joint narrowing, intercarpal joint narrowing or radiocarpal joint narrowing was noted.  Erosive changes were noted in the base of the third metacarpal.  There was some radiographic progression in the first and fourth DIP joint compared to his x-rays obtained on October 09, 2014. Impression: These findings are consistent with psoriatic arthritis with some radiographic progression.   Speciality Comments: No specialty comments available.    Procedures:  No procedures performed Allergies: Penicillins   Assessment / Plan:     Visit Diagnoses: Psoriatic arthritis ([place]) - Severe aggressive disease: He has synovitis and tenderness of his right 1st DIP joint.  He has synovial thickening of all of his DIP joints.  He will continue on [name] 20 mg subcutaneous every week, folic acid 2 mg by mouth daily, Cosentyx 150 mg subcutaneous every month. CBC and CMP were ordered today.  X-rays were performed today.   - Plan: XR Hand 2 View Left, XR Hand 2 View Right.  Some radiographic progression was noted in the right first and fourth DIP joints.  He has not had x-rays in the last 3 years.  The time of  progression is difficult to assess.  He still have some synovitis in his right first DIP joint.  I have advised him to use a splint in that digit and use Voltaren gel topically.  We will repeat x-rays again in 1 year.  If he continues to have pain and discomfort in the joint is supposed to notify me.  High risk medication use - [name] 20 mg subcutaneous every week, folic acid 2 mg by mouth daily, Cosentyx 150 mg subcutaneous every month (May 2018) (Humira caused side effects).  CBC and CMP were drawn today to monitor for drug toxicity.  He will have TB gold drawn with his next labs in June.- Plan: CBC with Differential/Platelet, COMPLETE METABOLIC PANEL WITH GFR  Prosthetic eye globe - left  Dyslipidemia    Orders: Orders Placed This Encounter  Procedures  . XR Hand 2 View Left  . XR Hand 2 View Right  . CBC with Differential/Platelet  . COMPLETE METABOLIC PANEL WITH GFR   No orders of the defined types were placed in this encounter.    Follow-Up Instructions: Return in about 5 months (around 09/10/2017) for Psoriatic arthritis.   Bo Merino, MD  Note - This record has been created using Editor, commissioning.  Chart creation errors have been sought, but may not always  have been located. Such creation errors do not reflect on  the standard of medical care.

## 2017-04-06 DIAGNOSIS — M5126 Other intervertebral disc displacement, lumbar region: Secondary | ICD-10-CM | POA: Diagnosis not present

## 2017-04-06 DIAGNOSIS — M545 Low back pain: Secondary | ICD-10-CM | POA: Diagnosis not present

## 2017-04-10 ENCOUNTER — Ambulatory Visit (INDEPENDENT_AMBULATORY_CARE_PROVIDER_SITE_OTHER): Payer: Self-pay

## 2017-04-10 ENCOUNTER — Ambulatory Visit (INDEPENDENT_AMBULATORY_CARE_PROVIDER_SITE_OTHER): Payer: BLUE CROSS/BLUE SHIELD | Admitting: Rheumatology

## 2017-04-10 ENCOUNTER — Encounter: Payer: Self-pay | Admitting: Rheumatology

## 2017-04-10 VITALS — BP 143/79 | HR 59 | Resp 17 | Ht 74.5 in | Wt 186.0 lb

## 2017-04-10 DIAGNOSIS — Z79899 Other long term (current) drug therapy: Secondary | ICD-10-CM

## 2017-04-10 DIAGNOSIS — L4050 Arthropathic psoriasis, unspecified: Secondary | ICD-10-CM | POA: Diagnosis not present

## 2017-04-10 DIAGNOSIS — Z970 Presence of artificial eye: Secondary | ICD-10-CM | POA: Diagnosis not present

## 2017-04-10 DIAGNOSIS — E785 Hyperlipidemia, unspecified: Secondary | ICD-10-CM | POA: Diagnosis not present

## 2017-04-10 NOTE — Patient Instructions (Addendum)
Standing Labs We placed an order today for your standing lab work.    Please come back and get your standing labs in June and every 3 months  CBC, CMP, and TB gold   We have open lab Monday through Friday from 8:30-11:30 AM and 1:30-4 PM at the office of Dr. Bo Merino.   The office is located at [address], [place], [place], [place] [address] No appointment is necessary.   Labs are drawn by [facility].  You may receive a bill from [facility] for your lab work. If you have any questions regarding directions or hours of operation,  please call [phone].

## 2017-04-11 LAB — COMPLETE METABOLIC PANEL WITH GFR
AG Ratio: 2 (calc) (ref 1.0–2.5)
ALBUMIN MSPROF: 4.7 g/dL (ref 3.6–5.1)
ALKALINE PHOSPHATASE (APISO): 52 U/L (ref 40–115)
ALT: 37 U/L (ref 9–46)
AST: 31 U/L (ref 10–35)
BUN: 23 mg/dL (ref 7–25)
CHLORIDE: 100 mmol/L (ref 98–110)
CO2: 32 mmol/L (ref 20–32)
Calcium: 9.8 mg/dL (ref 8.6–10.3)
Creat: 1.08 mg/dL (ref 0.70–1.33)
GFR, EST AFRICAN AMERICAN: 89 mL/min/{1.73_m2} (ref >=60)
GFR, Est Non African American: 77 mL/min/{1.73_m2} (ref >=60)
Globulin: 2.4 g/dL (calc) (ref 1.9–3.7)
Glucose, Bld: 103 mg/dL — ABNORMAL HIGH (ref 65–99)
Potassium: 5 mmol/L (ref 3.5–5.3)
Sodium: 138 mmol/L (ref 135–146)
TOTAL PROTEIN: 7.1 g/dL (ref 6.1–8.1)
Total Bilirubin: 0.5 mg/dL (ref 0.2–1.2)

## 2017-04-11 LAB — CBC WITH DIFFERENTIAL/PLATELET
BASOS PCT: 0.6 %
Basophils Absolute: 28 cells/uL (ref 0–200)
Eosinophils Absolute: 52 cells/uL (ref 15–500)
Eosinophils Relative: 1.1 %
HCT: 42 % (ref 38.5–50.0)
Hemoglobin: 14.1 g/dL (ref 13.2–17.1)
LYMPHS ABS: 1579 {cells}/uL (ref 850–3900)
MCH: 30.8 pg (ref 27.0–33.0)
MCHC: 33.6 g/dL (ref 32.0–36.0)
MCV: 91.7 fL (ref 80.0–100.0)
MPV: 9.9 fL (ref 7.5–12.5)
Monocytes Relative: 9 %
NEUTROS PCT: 55.7 %
Neutro Abs: 2618 cells/uL (ref 1500–7800)
Platelets: 246 10*3/uL (ref 140–400)
RBC: 4.58 10*6/uL (ref 4.20–5.80)
RDW: 13.3 % (ref 11.0–15.0)
Total Lymphocyte: 33.6 %
WBC: 4.7 10*3/uL (ref 3.8–10.8)
WBCMIX: 423 {cells}/uL (ref 200–950)

## 2017-04-11 NOTE — Progress Notes (Signed)
Glucose borderline elevated. All other labs are WNL.

## 2017-04-20 DIAGNOSIS — M5126 Other intervertebral disc displacement, lumbar region: Secondary | ICD-10-CM | POA: Diagnosis not present

## 2017-04-20 DIAGNOSIS — M545 Low back pain: Secondary | ICD-10-CM | POA: Diagnosis not present

## 2017-05-04 DIAGNOSIS — M545 Low back pain: Secondary | ICD-10-CM | POA: Diagnosis not present

## 2017-05-04 DIAGNOSIS — M5126 Other intervertebral disc displacement, lumbar region: Secondary | ICD-10-CM | POA: Diagnosis not present

## 2017-05-10 ENCOUNTER — Other Ambulatory Visit: Payer: Self-pay | Admitting: Rheumatology

## 2017-05-10 NOTE — Telephone Encounter (Signed)
Last Visit: 04/10/17 Next Visit: 09/11/17 Labs: 04/10/17 Glucose borderline elevated. All other labs are WNL. TB Gold: 06/02/16 Neg   Okay to refill per Dr. Estanislado Pandy

## 2017-05-17 ENCOUNTER — Other Ambulatory Visit: Payer: Self-pay | Admitting: Rheumatology

## 2017-05-17 NOTE — Telephone Encounter (Signed)
Last Visit: 04/10/17 Next Visit: 09/11/17 Labs: 04/10/17 Glucose borderline elevated. All other labs are WNL.  Okay to refill per Dr. Estanislado Pandy

## 2017-05-18 DIAGNOSIS — M545 Low back pain: Secondary | ICD-10-CM | POA: Diagnosis not present

## 2017-05-18 DIAGNOSIS — M5126 Other intervertebral disc displacement, lumbar region: Secondary | ICD-10-CM | POA: Diagnosis not present

## 2017-06-27 ENCOUNTER — Other Ambulatory Visit: Payer: Self-pay | Admitting: Rheumatology

## 2017-06-27 NOTE — Telephone Encounter (Signed)
Last visit: 04/10/2017 Next visit: 09/11/2017  Okay to refill per Dr. Estanislado Pandy.

## 2017-08-28 NOTE — Progress Notes (Signed)
Office Visit Note  Patient: Jacob Pena             Date of Birth: Feb 15, 1960           MRN: [id number]             PCP: Denita Lung, MD Referring: Denita Lung, MD Visit Date: 09/11/2017 Occupation: @GUAROCC @  Subjective:  Hand swelling.   History of Present Illness: Jacob Pena is a 57 y.o. male with history of psoriatic arthritis.  He has been through stress recently and has been having a flare with increased pain and swelling in his hands which she describes in his right second and fifth digit DIPs.  No other joints are painful.  Activities of Daily Living:  Patient reports morning stiffness for 10 minutes.   Patient Denies nocturnal pain.  Difficulty dressing/grooming: Denies Difficulty climbing stairs: Denies Difficulty getting out of chair: Denies Difficulty using hands for taps, buttons, cutlery, and/or writing: Denies  Review of Systems  Constitutional: Positive for fatigue. Negative for night sweats.  HENT: Negative for mouth sores, mouth dryness and nose dryness.   Eyes: Negative for redness and dryness.  Respiratory: Negative for shortness of breath and difficulty breathing.   Cardiovascular: Negative for chest pain, palpitations, hypertension, irregular heartbeat and swelling in legs/feet.  Gastrointestinal: Negative for constipation and diarrhea.  Endocrine: Negative for increased urination.  Genitourinary: Negative for difficulty urinating.  Musculoskeletal: Positive for arthralgias, joint pain, joint swelling and morning stiffness. Negative for myalgias, muscle weakness, muscle tenderness and myalgias.  Skin: Negative for color change, rash, hair loss, nodules/bumps, skin tightness, ulcers and sensitivity to sunlight.  Allergic/Immunologic: Negative for susceptible to infections.  Neurological: Negative for dizziness, fainting, memory loss, night sweats and weakness.  Hematological: Negative for bruising/bleeding tendency and swollen glands.    Psychiatric/Behavioral: Negative for depressed mood and sleep disturbance. The patient is not nervous/anxious.     PMFS History:  Patient Active Problem List   Diagnosis Date Noted  . 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 Psoriatic arthritis ([place]) 01/08/2016  . Spondyloarthropathy ([place]) 01/08/2016  . High risk medication use 01/08/2016  . Familial hypercholesterolemia 05/18/2011  . Dyslipidemia 10/07/2008    Past Medical History:  Diagnosis Date  . Anxiety   . Depression   . Rheumatoid arthritis ([place])     Family History  Problem Relation Age of Onset  . Colon cancer Paternal Grandmother   . Diverticulitis Mother   . Breast cancer Mother   . Prostate cancer Father    Past Surgical History:  Procedure Laterality Date  . APPENDECTOMY    . EYE SURGERY     left eye/prosthetic eye  . STOMACH SURGERY     floating ligament  . TONSILLECTOMY     Social History   Social History Narrative  . Not on file    Objective: Vital Signs: BP 115/67 (BP Location: Left Arm, Patient Position: Sitting, Cuff Size: Normal)   Pulse 63   Resp 16   Ht 6\' 3"  (1.905 m)   Wt 175 lb 12.8 oz (79.7 kg)   BMI 21.97 kg/m    Physical Exam  Constitutional: He is oriented to person, place, and time. He appears well-developed and well-nourished.  HENT:  Head: Normocephalic and atraumatic.  Eyes: Pupils are equal, round, and reactive to light. Conjunctivae and EOM are normal.  Neck: Normal range of motion. Neck supple.  Cardiovascular: Normal rate, regular rhythm and normal heart sounds.  Pulmonary/Chest: Effort normal and breath sounds  normal.  Abdominal: Soft. Bowel sounds are normal.  Neurological: He is alert and oriented to person, place, and time.  Skin: Skin is warm and dry. Capillary refill takes less than 2 seconds.  Psychiatric: He has a normal mood and affect. His behavior is normal.  Nursing note and vitals reviewed.    Musculoskeletal Exam: C-spine thoracic lumbar spine good range  of motion.  No SI joint tenderness was noted.  Shoulder joints elbow joints wrist joints were in good range of motion.  He has synovitis in his right first DIP joint.  All other joints are full range of motion with no synovitis.  CDAI Exam: CDAI Homunculus Exam:   Tenderness:  Right hand: 2nd DIP  Swelling:  Right hand: 2nd DIP  Joint Counts:  CDAI Tender Joint count: 0 CDAI Swollen Joint count: 0  Global Assessments:  Patient Global Assessment: 2 Provider Global Assessment: 2  CDAI Calculated Score: 4   Investigation: No additional findings.  Imaging: No results found.  Recent Labs: Lab Results  Component Value Date   WBC 4.7 04/10/2017   HGB 14.1 04/10/2017   PLT 246 04/10/2017   NA 138 04/10/2017   K 5.0 04/10/2017   CL 100 04/10/2017   CO2 32 04/10/2017   GLUCOSE 103 (H) 04/10/2017   BUN 23 04/10/2017   CREATININE 1.08 04/10/2017   BILITOT 0.5 04/10/2017   ALKPHOS 51 07/18/2016   AST 31 04/10/2017   ALT 37 04/10/2017   PROT 7.1 04/10/2017   ALBUMIN 4.2 07/18/2016   CALCIUM 9.8 04/10/2017   GFRAA 89 04/10/2017    Speciality Comments: No specialty comments available.  Procedures:  No procedures performed Allergies: Penicillins   Assessment / Plan:     Visit Diagnoses: Psoriatic arthritis ([place]) - Severe aggressive disease which has been quite well controlled on combination of [name] and Cosentyx.  He is having mild flare in the right second digit DIP joint.  He has been under a lot of stress and going through grieving process as he lost his partner recently.  High risk medication use - [name] 20 mg subcutaneous every week, folic acid 2 mg by mouth daily, Cosentyx subcutaneous every month (May 2018) (Humira caused side effects).  - Plan: CBC with Differential/Platelet, COMPLETE METABOLIC PANEL WITH GFR, QuantiFERON-TB Gold Plus today and then CBC and CMP will be done every 3 months.  Dyslipidemia  Prosthetic eye globe - left   Orders: Orders Placed  This Encounter  Procedures  . CBC with Differential/Platelet  . COMPLETE METABOLIC PANEL WITH GFR  . QuantiFERON-TB Gold Plus   No orders of the defined types were placed in this encounter.    Follow-Up Instructions: Return in about 5 months (around 02/11/2018).   Jacob Merino, MD  Note - This record has been created using Editor, commissioning.  Chart creation errors have been sought, but may not always  have been located. Such creation errors do not reflect on  the standard of medical care.

## 2017-09-11 ENCOUNTER — Ambulatory Visit: Payer: BLUE CROSS/BLUE SHIELD | Admitting: Rheumatology

## 2017-09-11 ENCOUNTER — Encounter: Payer: Self-pay | Admitting: Physician Assistant

## 2017-09-11 ENCOUNTER — Ambulatory Visit (INDEPENDENT_AMBULATORY_CARE_PROVIDER_SITE_OTHER): Payer: BLUE CROSS/BLUE SHIELD | Admitting: Rheumatology

## 2017-09-11 VITALS — BP 115/67 | HR 63 | Resp 16 | Ht 75.0 in | Wt 175.8 lb

## 2017-09-11 DIAGNOSIS — E785 Hyperlipidemia, unspecified: Secondary | ICD-10-CM

## 2017-09-11 DIAGNOSIS — L4050 Arthropathic psoriasis, unspecified: Secondary | ICD-10-CM | POA: Diagnosis not present

## 2017-09-11 DIAGNOSIS — Z970 Presence of artificial eye: Secondary | ICD-10-CM | POA: Diagnosis not present

## 2017-09-11 DIAGNOSIS — Z79899 Other long term (current) drug therapy: Secondary | ICD-10-CM | POA: Diagnosis not present

## 2017-09-11 NOTE — Patient Instructions (Signed)
Standing Labs We placed an order today for your standing lab work.    Please come back and get your standing labs in November and every b3 months  We have open lab Monday through Friday from 8:30-11:30 AM and 1:30-4:00 PM  at the office of Dr. Bo Merino.   You may experience shorter wait times on Monday and Friday afternoons. The office is located at [address] [place], [place], [place] [address] No appointment is necessary.   Labs are drawn by [facility].  You may receive a bill from [facility] for your lab work. If you have any questions regarding directions or hours of operation,  please call [phone].

## 2017-10-11 ENCOUNTER — Other Ambulatory Visit: Payer: Self-pay | Admitting: Rheumatology

## 2017-10-11 DIAGNOSIS — F4321 Adjustment disorder with depressed mood: Secondary | ICD-10-CM | POA: Diagnosis not present

## 2017-10-11 NOTE — Telephone Encounter (Addendum)
Last visit: 09/11/17 Next visit: 02/12/18 Labs: 04/10/17 Glucose borderline elevated. All other labs are WNL. Tb Gold: 06/17/16 Neg   Attempted to contact the patient and unable to leave a message, mailbox is full.   Okay to refill 30 day supply of Cosentyx and  [name]?

## 2017-10-12 NOTE — Telephone Encounter (Signed)
We need labs before refilling his medications.

## 2017-10-18 DIAGNOSIS — F4321 Adjustment disorder with depressed mood: Secondary | ICD-10-CM | POA: Diagnosis not present

## 2017-10-25 DIAGNOSIS — F4321 Adjustment disorder with depressed mood: Secondary | ICD-10-CM | POA: Diagnosis not present

## 2017-11-01 ENCOUNTER — Other Ambulatory Visit: Payer: Self-pay | Admitting: Rheumatology

## 2017-11-01 NOTE — Telephone Encounter (Signed)
Last visit: 09/11/17 Next visit: 02/12/18  Okay to refill per Dr. Deveshwar 

## 2017-11-07 DIAGNOSIS — F4321 Adjustment disorder with depressed mood: Secondary | ICD-10-CM | POA: Diagnosis not present

## 2017-12-05 DIAGNOSIS — F4321 Adjustment disorder with depressed mood: Secondary | ICD-10-CM | POA: Diagnosis not present

## 2018-01-14 ENCOUNTER — Other Ambulatory Visit: Payer: Self-pay | Admitting: Rheumatology

## 2018-01-15 NOTE — Telephone Encounter (Signed)
Last visit: 09/11/17 Next visit: 02/12/18  Okay to refill per Dr. Estanislado Pandy

## 2018-01-17 ENCOUNTER — Other Ambulatory Visit: Payer: Self-pay | Admitting: *Deleted

## 2018-01-17 DIAGNOSIS — Z79899 Other long term (current) drug therapy: Secondary | ICD-10-CM | POA: Diagnosis not present

## 2018-01-17 DIAGNOSIS — Z9225 Personal history of immunosupression therapy: Secondary | ICD-10-CM | POA: Diagnosis not present

## 2018-01-18 ENCOUNTER — Telehealth: Payer: Self-pay | Admitting: *Deleted

## 2018-01-18 MED ORDER — SECUKINUMAB (300 MG DOSE) 150 MG/ML ~~LOC~~ SOAJ: 300.0000 mg | SUBCUTANEOUS | 0 refills | Status: DC

## 2018-01-18 MED ORDER — METHOTREXATE (PF) 20 MG/0.4ML ~~LOC~~ SOAJ: SUBCUTANEOUS | 0 refills | Status: DC

## 2018-01-18 NOTE — Telephone Encounter (Signed)
-----   Message from Bo Merino, MD sent at 01/18/2018  9:26 AM EST ----- Labs are stable

## 2018-01-18 NOTE — Progress Notes (Signed)
Labs are stable.

## 2018-01-18 NOTE — Telephone Encounter (Signed)
Patient requesting refills on Cosentyx and [name].  Last visit: 09/11/17 Next visit: 02/12/18 Labs: 01/17/18 stable  Tb Gold: 06/02/16 neg (updated 01/17/18)  Okay to refill per Dr. Estanislado Pandy

## 2018-01-20 LAB — COMPLETE METABOLIC PANEL WITH GFR
AG Ratio: 1.9 (calc) (ref 1.0–2.5)
ALT: 22 U/L (ref 9–46)
AST: 27 U/L (ref 10–35)
Albumin: 4.3 g/dL (ref 3.6–5.1)
Alkaline phosphatase (APISO): 53 U/L (ref 40–115)
BILIRUBIN TOTAL: 0.5 mg/dL (ref 0.2–1.2)
BUN: 17 mg/dL (ref 7–25)
CO2: 28 mmol/L (ref 20–32)
Calcium: 9 mg/dL (ref 8.6–10.3)
Chloride: 97 mmol/L — ABNORMAL LOW (ref 98–110)
Creat: 1.04 mg/dL (ref 0.70–1.33)
GFR, Est African American: 93 mL/min/{1.73_m2} (ref >=60)
GFR, Est Non African American: 80 mL/min/{1.73_m2} (ref >=60)
Globulin: 2.3 g/dL (calc) (ref 1.9–3.7)
Glucose, Bld: 93 mg/dL (ref 65–99)
Potassium: 5.1 mmol/L (ref 3.5–5.3)
Sodium: 132 mmol/L — ABNORMAL LOW (ref 135–146)
TOTAL PROTEIN: 6.6 g/dL (ref 6.1–8.1)

## 2018-01-20 LAB — CBC WITH DIFFERENTIAL/PLATELET
Absolute Monocytes: 639 cells/uL (ref 200–950)
Basophils Absolute: 27 cells/uL (ref 0–200)
Basophils Relative: 0.4 %
EOS PCT: 0.7 %
Eosinophils Absolute: 48 cells/uL (ref 15–500)
HCT: 40.8 % (ref 38.5–50.0)
Hemoglobin: 13.4 g/dL (ref 13.2–17.1)
Lymphs Abs: 1503 cells/uL (ref 850–3900)
MCH: 30.2 pg (ref 27.0–33.0)
MCHC: 32.8 g/dL (ref 32.0–36.0)
MCV: 92.1 fL (ref 80.0–100.0)
MPV: 10 fL (ref 7.5–12.5)
Monocytes Relative: 9.4 %
Neutro Abs: 4583 cells/uL (ref 1500–7800)
Neutrophils Relative %: 67.4 %
Platelets: 238 10*3/uL (ref 140–400)
RBC: 4.43 10*6/uL (ref 4.20–5.80)
RDW: 12.6 % (ref 11.0–15.0)
Total Lymphocyte: 22.1 %
WBC: 6.8 10*3/uL (ref 3.8–10.8)

## 2018-01-20 LAB — QUANTIFERON-TB GOLD PLUS
NIL: 0.02 IU/mL
QuantiFERON-TB Gold Plus: NEGATIVE
TB1-NIL: 0 [IU]/mL
TB2-NIL: 0 IU/mL

## 2018-01-29 NOTE — Progress Notes (Signed)
Office Visit Note  Patient: Jacob Pena             Date of Birth: February 28, 1960           MRN: [id number]             PCP: Denita Lung, MD Referring: Denita Lung, MD Visit Date: 02/12/2018 Occupation: @GUAROCC @  Subjective:  Swelling in right hand.  Cosentyx and methotrexate.  Most recent TB gold negative on 01/17/2018.  Most recent CBC/CMP stable on 01/17/2018 and will monitor every 3 months.  Standing orders are in place.  He received his flu vaccine in October and previously had Pneumovax 23.  Recommend Prevnar 13 and Shingrix as indicated.  History of Present Illness: Jacob Pena is a 57 y.o. male with history of psoriatic arthritis.  He states he has been having some swelling in his right index finger DIP joint.  He still has intermittent swelling in his fifth digit DIP joint.  Left hand has chronic thickening of the joints but no increased pain.  He has been active and working out.  He has been lifting weights.  No psoriasis rash has been reported.  Mild flare of disc disease with pinched nerve.  He states is getting better gradually.  He missed 2 doses of methotrexate due to some lab issues.  Now he is on regular methotrexate dosing.  Activities of Daily Living:  Patient reports morning stiffness for 30 minutes.   Patient Denies nocturnal pain.  Difficulty dressing/grooming: Denies Difficulty climbing stairs: Denies Difficulty getting out of chair: Denies Difficulty using hands for taps, buttons, cutlery, and/or writing: Denies  Review of Systems  Constitutional: Negative for fatigue and night sweats.  HENT: Negative for mouth sores, mouth dryness and nose dryness.   Eyes: Negative for redness and dryness.  Respiratory: Negative for shortness of breath and difficulty breathing.   Cardiovascular: Negative for chest pain, palpitations, hypertension, irregular heartbeat and swelling in legs/feet.  Gastrointestinal: Negative for constipation and diarrhea.  Endocrine:  Negative for increased urination.  Musculoskeletal: Positive for arthralgias, joint pain, joint swelling and morning stiffness. Negative for myalgias, muscle weakness, muscle tenderness and myalgias.  Skin: Negative for color change, rash, hair loss, nodules/bumps, skin tightness, ulcers and sensitivity to sunlight.  Allergic/Immunologic: Negative for susceptible to infections.  Neurological: Negative for dizziness, fainting, memory loss, night sweats and weakness ( ).  Hematological: Negative for swollen glands.  Psychiatric/Behavioral: Negative for depressed mood and sleep disturbance. The patient is not nervous/anxious.     PMFS History:  Patient Active Problem List   Diagnosis Date Noted  . 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 Psoriatic arthritis ([place]) 01/08/2016  . Spondyloarthropathy 01/08/2016  . High risk medication use 01/08/2016  . Familial hypercholesterolemia 05/18/2011  . Dyslipidemia 10/07/2008    Past Medical History:  Diagnosis Date  . Anxiety   . Depression   . Rheumatoid arthritis ([place])     Family History  Problem Relation Age of Onset  . Colon cancer Paternal Grandmother   . Diverticulitis Mother   . Breast cancer Mother   . Prostate cancer Father    Past Surgical History:  Procedure Laterality Date  . APPENDECTOMY    . EYE SURGERY     left eye/prosthetic eye  . STOMACH SURGERY     floating ligament  . TONSILLECTOMY     Social History   Social History Narrative  . Not on file    Objective: Vital Signs: BP 121/72 (BP Location: Left  Arm, Patient Position: Sitting, Cuff Size: Normal)   Pulse 61   Resp 14   Ht 6' 2.5" (1.892 m)   Wt 185 lb 3.2 oz (84 kg)   BMI 23.46 kg/m    Physical Exam Vitals signs and nursing note reviewed.  Constitutional:      Appearance: He is well-developed.  HENT:     Head: Normocephalic and atraumatic.  Eyes:     Conjunctiva/sclera: Conjunctivae normal.     Pupils: Pupils are equal, round, and reactive to light.    Neck:     Musculoskeletal: Normal range of motion and neck supple.  Cardiovascular:     Rate and Rhythm: Normal rate and regular rhythm.     Heart sounds: Normal heart sounds.  Pulmonary:     Effort: Pulmonary effort is normal.     Breath sounds: Normal breath sounds.  Abdominal:     General: Bowel sounds are normal.     Palpations: Abdomen is soft.  Skin:    General: Skin is warm and dry.     Capillary Refill: Capillary refill takes less than 2 seconds.  Neurological:     Mental Status: He is alert and oriented to person, place, and time.  Psychiatric:        Behavior: Behavior normal.      Musculoskeletal Exam: Spine thoracic lumbar spine good range of motion.  Shoulder joints elbow joints wrist joint MCPs PIPs been good range of motion with no synovitis.  He had synovitis over right index finger DIP.  He has thickening of all DIPs but no synovitis.  Hip joints, knee joints, ankles, MTPs and PIPs been good range of motion with no synovitis.  CDAI Exam: CDAI Score: 0.4  Patient Global Assessment: 2 (mm); Provider Global Assessment: 2 (mm) Swollen: 1 ; Tender: 1  Joint Exam      Right  Left  DIP 2  Swollen Tender        Investigation: No additional findings.  Imaging: No results found.  Recent Labs: Lab Results  Component Value Date   WBC 6.8 01/17/2018   HGB 13.4 01/17/2018   PLT 238 01/17/2018   NA 132 (L) 01/17/2018   K 5.1 01/17/2018   CL 97 (L) 01/17/2018   CO2 28 01/17/2018   GLUCOSE 93 01/17/2018   BUN 17 01/17/2018   CREATININE 1.04 01/17/2018   BILITOT 0.5 01/17/2018   ALKPHOS 51 07/18/2016   AST 27 01/17/2018   ALT 22 01/17/2018   PROT 6.6 01/17/2018   ALBUMIN 4.2 07/18/2016   CALCIUM 9.0 01/17/2018   GFRAA 93 01/17/2018   QFTBGOLDPLUS NEGATIVE 01/17/2018    Speciality Comments: No specialty comments available.  Procedures:  No procedures performed Allergies: Penicillins   Assessment / Plan:     Visit Diagnoses: Psoriatic arthritis  ([place]) - Severe aggressive disease.  He had a small flare while he was off methotrexate.  He had synovitis in his first DIP joint of his index finger.  He states at this point he would use Voltaren gel and rested.  I offered cortisone which she declined.  High risk medication use - [name] 20 mg subcutaneous every week, folic acid 2 mg by mouth daily, Cosentyx subcutaneous every month (May 2018) (Humira caused side effects).  His labs have been normal.  We will continue to monitor labs every 3 months.  Prosthetic eye globe - left  Dyslipidemia   Orders: No orders of the defined types were placed in this encounter.  No  orders of the defined types were placed in this encounter.     Follow-Up Instructions: Return in about 5 months (around 07/14/2018) for Psoriatic arthritis.   Bo Merino, MD  Note - This record has been created using Editor, commissioning.  Chart creation errors have been sought, but may not always  have been located. Such creation errors do not reflect on  the standard of medical care.

## 2018-02-12 ENCOUNTER — Ambulatory Visit (INDEPENDENT_AMBULATORY_CARE_PROVIDER_SITE_OTHER): Payer: BLUE CROSS/BLUE SHIELD | Admitting: Rheumatology

## 2018-02-12 ENCOUNTER — Encounter: Payer: Self-pay | Admitting: Physician Assistant

## 2018-02-12 VITALS — BP 121/72 | HR 61 | Resp 14 | Ht 74.5 in | Wt 185.2 lb

## 2018-02-12 DIAGNOSIS — Z79899 Other long term (current) drug therapy: Secondary | ICD-10-CM

## 2018-02-12 DIAGNOSIS — L4050 Arthropathic psoriasis, unspecified: Secondary | ICD-10-CM | POA: Diagnosis not present

## 2018-02-12 DIAGNOSIS — Z970 Presence of artificial eye: Secondary | ICD-10-CM

## 2018-02-12 DIAGNOSIS — E785 Hyperlipidemia, unspecified: Secondary | ICD-10-CM

## 2018-02-12 NOTE — Patient Instructions (Signed)
Standing Labs We placed an order today for your standing lab work.    Please come back and get your standing labs in March and every 3 months  We have open lab Monday through Friday from 8:30-11:30 AM and 1:30-4:00 PM  at the office of Dr. Elvy Mclarty.   You may experience shorter wait times on Monday and Friday afternoons. The office is located at [address], [address], [place], [place] [address] No appointment is necessary.   Labs are drawn by [facility].  You may receive a bill from [facility] for your lab work.  If you wish to have your labs drawn at another location, please call the office 24 hours in advance to send orders.  If you have any questions regarding directions or hours of operation,  please call [phone].   Just as a reminder please drink plenty of water prior to coming for your lab work. Thanks!  

## 2018-04-03 ENCOUNTER — Other Ambulatory Visit: Payer: Self-pay | Admitting: Rheumatology

## 2018-04-03 NOTE — Telephone Encounter (Signed)
Last Visit: 02/12/18 Next visit: 07/16/18 Labs: 01/17/18 stable  Okay to refill per Dr. Estanislado Pandy

## 2018-04-09 ENCOUNTER — Encounter: Payer: Self-pay | Admitting: Gastroenterology

## 2018-04-10 ENCOUNTER — Other Ambulatory Visit: Payer: Self-pay | Admitting: Rheumatology

## 2018-04-10 NOTE — Telephone Encounter (Signed)
Last Visit: 02/12/18 Next visit: 07/16/18 Labs: 01/17/18 stable TB Gold: 01/17/18 Neg   Patient reminded he is due to update labs this month.   Okay to refill per Dr. Estanislado Pandy

## 2018-05-10 ENCOUNTER — Other Ambulatory Visit: Payer: Self-pay

## 2018-05-10 ENCOUNTER — Ambulatory Visit (AMBULATORY_SURGERY_CENTER): Payer: BLUE CROSS/BLUE SHIELD

## 2018-05-10 VITALS — Ht 74.5 in | Wt 178.0 lb

## 2018-05-10 DIAGNOSIS — Z86010 Personal history of colonic polyps: Secondary | ICD-10-CM

## 2018-05-10 MED ORDER — PEG 3350-KCL-NA BICARB-NACL 420 G PO SOLR: 4000.0000 mL | Freq: Once | ORAL | 0 refills | Status: AC

## 2018-05-10 NOTE — Progress Notes (Signed)
Per pt, no allergies to soy or egg products.Pt not taking any weight loss meds or using  O2 at home. No sedation problems.  Emmi information sent to patient's email.   The pre-visit was done over the phone due to COVID-19.  I verified pt's address with him. Pt was informed to send a copy of his insurance card to [employer] per email. He will do today. Paperwork and prep instructions will be mailed to the pt.  Pt was advised to call with any questions or any changes prior to his procedure. Pt understood. Gwyndolyn Saxon in [facility]

## 2018-05-25 ENCOUNTER — Encounter: Payer: Self-pay | Admitting: Gastroenterology

## 2018-05-29 ENCOUNTER — Encounter: Payer: Self-pay | Admitting: Gastroenterology

## 2018-06-08 ENCOUNTER — Encounter: Payer: Self-pay | Admitting: Gastroenterology

## 2018-06-20 ENCOUNTER — Telehealth: Payer: Self-pay | Admitting: *Deleted

## 2018-06-20 NOTE — Telephone Encounter (Signed)
Covid-19 travel screening questions  Have you traveled in the last 14 days? No If yes where?  Do you now or have you had a fever in the last 14 days? No  Do you have any respiratory symptoms of shortness of breath or cough now or in the last 14 days? No  Do you have any family members or close contacts with diagnosed or suspected Covid-19? No  Patient aware care partner needs to wait in the car in the parking lot and he needs to wear a mask into the building.

## 2018-06-22 ENCOUNTER — Telehealth: Payer: Self-pay

## 2018-06-22 ENCOUNTER — Encounter: Payer: Self-pay | Admitting: Gastroenterology

## 2018-06-22 ENCOUNTER — Other Ambulatory Visit: Payer: Self-pay

## 2018-06-22 ENCOUNTER — Ambulatory Visit (AMBULATORY_SURGERY_CENTER): Payer: BLUE CROSS/BLUE SHIELD | Admitting: Gastroenterology

## 2018-06-22 VITALS — BP 116/62 | HR 51 | Temp 98.3°F | Resp 16 | Ht 74.0 in | Wt 178.0 lb

## 2018-06-22 DIAGNOSIS — D128 Benign neoplasm of rectum: Secondary | ICD-10-CM

## 2018-06-22 DIAGNOSIS — Z1211 Encounter for screening for malignant neoplasm of colon: Secondary | ICD-10-CM | POA: Diagnosis not present

## 2018-06-22 DIAGNOSIS — Z86010 Personal history of colonic polyps: Secondary | ICD-10-CM

## 2018-06-22 DIAGNOSIS — K635 Polyp of colon: Secondary | ICD-10-CM

## 2018-06-22 DIAGNOSIS — D129 Benign neoplasm of anus and anal canal: Secondary | ICD-10-CM

## 2018-06-22 HISTORY — PX: COLONOSCOPY: SHX174

## 2018-06-22 MED ORDER — SODIUM CHLORIDE 0.9 % IV SOLN: 500.0000 mL | Freq: Once | INTRAVENOUS | Status: DC

## 2018-06-22 NOTE — Patient Instructions (Signed)
Discharge instructions given. Handout on polyps. Resume previous medications. YOU HAD AN ENDOSCOPIC PROCEDURE TODAY AT [facility]:   Refer to the procedure report that was given to you for any specific questions about what was found during the examination.  If the procedure report does not answer your questions, please call your gastroenterologist to clarify.  If you requested that your care partner not be given the details of your procedure findings, then the procedure report has been included in a sealed envelope for you to review at your convenience later.  YOU SHOULD EXPECT: Some feelings of bloating in the abdomen. Passage of more gas than usual.  Walking can help get rid of the air that was put into your GI tract during the procedure and reduce the bloating. If you had a lower endoscopy (such as a colonoscopy or flexible sigmoidoscopy) you may notice spotting of blood in your stool or on the toilet paper. If you underwent a bowel prep for your procedure, you may not have a normal bowel movement for a few days.  Please Note:  You might notice some irritation and congestion in your nose or some drainage.  This is from the oxygen used during your procedure.  There is no need for concern and it should clear up in a day or so.  SYMPTOMS TO REPORT IMMEDIATELY:   Following lower endoscopy (colonoscopy or flexible sigmoidoscopy):  Excessive amounts of blood in the stool  Significant tenderness or worsening of abdominal pains  Swelling of the abdomen that is new, acute  Fever of 100F or higher   For urgent or emergent issues, a gastroenterologist can be reached at any hour by calling [phone].   DIET:  We do recommend a small meal at first, but then you may proceed to your regular diet.  Drink plenty of fluids but you should avoid alcoholic beverages for 24 hours.  ACTIVITY:  You should plan to take it easy for the rest of today and you should NOT DRIVE or use heavy  machinery until tomorrow (because of the sedation medicines used during the test).    FOLLOW UP: Our staff will call the number listed on your records 48-72 hours following your procedure to check on you and address any questions or concerns that you may have regarding the information given to you following your procedure. If we do not reach you, we will leave a message.  We will attempt to reach you two times.  During this call, we will ask if you have developed any symptoms of COVID 19. If you develop any symptoms (for example fever, flu-like symptoms, shortness of breath, cough etc.) before then, please call [phone].  If any biopsies were taken you will be contacted by phone or by letter within the next 1-3 weeks.  Please call us at [phone] if you have not heard about the biopsies in 3 weeks.    SIGNATURES/CONFIDENTIALITY: You and/or your care partner have signed paperwork which will be entered into your electronic medical record.  These signatures attest to the fact that that the information above on your After Visit Summary has been reviewed and is understood.  Full responsibility of the confidentiality of this discharge information lies with you and/or your care-partner.

## 2018-06-22 NOTE — Op Note (Signed)
[facility] Patient Name: Jacob Pena Procedure Date: 06/22/2018 1:44 PM MRN: [id number] Endoscopist: Milus Banister , MD Age: 58 Referring MD:  Date of Birth: 1960-12-07 Gender: Male Account #: [id number] Procedure:                Colonoscopy Indications:              High risk colon cancer surveillance: Personal                            history of colonic polyps; colonoscopy 2015 single                            subCM adenoma Medicines:                Monitored Anesthesia Care Procedure:                Pre-Anesthesia Assessment:                           - Prior to the procedure, a History and Physical                            was performed, and patient medications and                            allergies were reviewed. The patient's tolerance of                            previous anesthesia was also reviewed. The risks                            and benefits of the procedure and the sedation                            options and risks were discussed with the patient.                            All questions were answered, and informed consent                            was obtained. Prior Anticoagulants: The patient has                            taken no previous anticoagulant or antiplatelet                            agents. ASA Grade Assessment: II - A patient with                            mild systemic disease. After reviewing the risks                            and benefits, the patient was deemed in  satisfactory condition to undergo the procedure.                           After obtaining informed consent, the colonoscope                            was passed under direct vision. Throughout the                            procedure, the patient's blood pressure, pulse, and                            oxygen saturations were monitored continuously. The                            Model CF-HQ190L [id number]) scope was introduced                             through the anus and advanced to the the cecum,                            identified by appendiceal orifice and ileocecal                            valve. The colonoscopy was performed without                            difficulty. The patient tolerated the procedure                            well. The quality of the bowel preparation was                            good. The ileocecal valve, appendiceal orifice, and                            rectum were photographed. Scope In: 1:45:04 PM Scope Out: 1:57:54 PM Scope Withdrawal Time: 0 hours 9 minutes 23 seconds  Total Procedure Duration: 0 hours 12 minutes 50 seconds  Findings:                 A 3 mm polyp was found in the descending colon. The                            polyp was sessile. The polyp was removed with a                            cold snare. Resection and retrieval were complete.                           The exam was otherwise without abnormality on                            direct and retroflexion views. Complications:  No immediate complications. Estimated blood loss:                            None. Estimated Blood Loss:     Estimated blood loss: none. Impression:               - One 3 mm polyp in the descending colon, removed                            with a cold snare. Resected and retrieved.                           - The examination was otherwise normal on direct                            and retroflexion views. Recommendation:           - Patient has a contact number available for                            emergencies. The signs and symptoms of potential                            delayed complications were discussed with the                            patient. Return to normal activities tomorrow.                            Written discharge instructions were provided to the                            patient.                           - Resume previous diet.                            - Continue present medications.                           You will receive a letter within 2-3 weeks with the                            pathology results and my final recommendations.                           If the polyp(s) is proven to be 'pre-cancerous' on                            pathology, you will need repeat colonoscopy in 7                            years. If the polyp(s) is NOT 'precancerous' on  pathology then you should repeat colon cancer                            screening in 10 years with colonoscopy without need                            for colon cancer screening by any method prior to                            then (including stool testing). Milus Banister, MD 06/22/2018 2:00:17 PM This report has been signed electronically.

## 2018-06-22 NOTE — Telephone Encounter (Signed)
Mr. Jacob Pena called with questions. He said he threw up after his second prep. His stool is clear with a few particles. Per Dr. Ardis Hughs ok for patient to still come in for procedure.

## 2018-06-22 NOTE — Progress Notes (Signed)
PT taken to PACU. Monitors in place. VSS. Report given to RN. 

## 2018-06-22 NOTE — Progress Notes (Signed)
VS taken by [facility] and Rica Mote

## 2018-06-22 NOTE — Progress Notes (Signed)
Called to room to assist during endoscopic procedure.  Patient ID and intended procedure confirmed with present staff. Received instructions for my participation in the procedure from the performing physician.  

## 2018-06-26 ENCOUNTER — Telehealth: Payer: Self-pay | Admitting: *Deleted

## 2018-06-26 NOTE — Telephone Encounter (Signed)
  Follow up Call-  Call back number 06/22/2018  Post procedure Call Back phone  # [phone]  Permission to leave phone message Yes  Some recent data might be hidden     Patient questions:  Do you have a fever, pain , or abdominal swelling? No. Pain Score  0 *  Have you tolerated food without any problems? Yes.    Have you been able to return to your normal activities? Yes.    Do you have any questions about your discharge instructions: Diet   No. Medications  No. Follow up visit  No.  Do you have questions or concerns about your Care? No.  Actions: * If pain score is 4 or above: No action needed, pain <4.  1. Have you developed a fever since your procedure? no  2.   Have you had an respiratory symptoms (SOB or cough) since your procedure? no  3.   Have you tested positive for COVID 19 since your procedure no  4.   Have you had any family members/close contacts diagnosed with the COVID 19 since your procedure?  no   If any of these questions are a yes, please inquire if patient has been seen by family doctor and route this note to Joylene John, Therapist, sports.

## 2018-06-28 ENCOUNTER — Encounter: Payer: Self-pay | Admitting: Gastroenterology

## 2018-07-02 NOTE — Progress Notes (Signed)
Office Visit Note  Patient: Jacob Pena             Date of Birth: February 06, 1960           MRN: [id number]             PCP: Denita Lung, MD Referring: Denita Lung, MD Visit Date: 07/16/2018 Occupation: @GUAROCC @  Subjective:  Right index finger pain     History of Present Illness: Render Jacob Pena is a 58 y.o. male with history of psoriatic arthritis.  He is on Cosentyx 300 mg sq injections every 28 days, MTX 20 mg sq once weekly, and folic acid 2 mg po daily.  He denies any recent psoriatic arthritis flares. He has no increased joint pain or joint swelling at this time.  He has inflammation of the right 1st DIP joint. He has been waking, biking, and doing home workout recently.   Activities of Daily Living:  Patient reports morning stiffness for 20 minute.   Patient Denies nocturnal pain.  Difficulty dressing/grooming: Denies Difficulty climbing stairs: Denies Difficulty getting out of chair: Denies Difficulty using hands for taps, buttons, cutlery, and/or writing: Denies  Review of Systems  Constitutional: Negative for fatigue and night sweats.  HENT: Negative for mouth sores, mouth dryness and nose dryness.   Eyes: Negative for pain, redness, visual disturbance and dryness.  Respiratory: Negative for cough, hemoptysis, shortness of breath, wheezing and difficulty breathing.   Cardiovascular: Negative for chest pain, palpitations, hypertension, irregular heartbeat and swelling in legs/feet.  Gastrointestinal: Negative for abdominal pain, blood in stool, constipation and diarrhea.  Endocrine: Negative for excessive thirst and increased urination.  Genitourinary: Negative for difficulty urinating and painful urination.  Musculoskeletal: Positive for arthralgias, joint pain and morning stiffness. Negative for joint swelling, myalgias, muscle weakness, muscle tenderness and myalgias.  Skin: Negative for color change, rash, hair loss, nodules/bumps, redness, skin tightness,  ulcers and sensitivity to sunlight.  Allergic/Immunologic: Negative for susceptible to infections.  Neurological: Negative for dizziness, fainting, headaches, memory loss, night sweats and weakness.  Hematological: Negative for bruising/bleeding tendency and swollen glands.  Psychiatric/Behavioral: Negative for depressed mood and sleep disturbance. The patient is not nervous/anxious.     PMFS History:  Patient Active Problem List   Diagnosis Date Noted  . 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 Psoriatic arthritis ([place]) 01/08/2016  . Spondyloarthropathy 01/08/2016  . High risk medication use 01/08/2016  . Familial hypercholesterolemia 05/18/2011  . Dyslipidemia 10/07/2008    Past Medical History:  Diagnosis Date  . Anxiety   . Depression    per pt/ never had depression  . Hyperlipidemia   . Rheumatoid arthritis ([place])     Family History  Problem Relation Age of Onset  . Colon cancer Paternal Grandmother   . Diverticulitis Mother   . Breast cancer Mother   . Prostate cancer Father   . Esophageal cancer Neg Hx   . Rectal cancer Neg Hx   . Stomach cancer Neg Hx    Past Surgical History:  Procedure Laterality Date  . APPENDECTOMY    . COLONOSCOPY    . EYE SURGERY     left eye/prosthetic eye  . STOMACH SURGERY  1994   floating ligament  . TONSILLECTOMY     Social History   Social History Narrative  . Not on file   Immunization History  Administered Date(s) Administered  . Influenza Split 10/30/2013, 11/30/2016  . Influenza,inj,Quad PF,6+ Mos 01/15/2016, 11/30/2016, 11/15/2017  . Influenza-Unspecified 11/15/2017  . Pneumococcal  Polysaccharide-23 11/17/2011  . Td 02/01/2007     Objective: Vital Signs: BP 139/77 (BP Location: Left Arm, Patient Position: Sitting, Cuff Size: Normal)   Pulse (!) 51   Resp 12   Ht 6\' 2"  (1.88 m)   Wt 186 lb (84.4 kg)   BMI 23.88 kg/m    Physical Exam Vitals signs and nursing note reviewed.  Constitutional:      Appearance: He is  well-developed.  HENT:     Head: Normocephalic and atraumatic.  Eyes:     Conjunctiva/sclera: Conjunctivae normal.     Pupils: Pupils are equal, round, and reactive to light.  Neck:     Musculoskeletal: Normal range of motion and neck supple.  Cardiovascular:     Rate and Rhythm: Normal rate and regular rhythm.     Heart sounds: Normal heart sounds.  Pulmonary:     Effort: Pulmonary effort is normal.     Breath sounds: Normal breath sounds.  Abdominal:     General: Bowel sounds are normal.     Palpations: Abdomen is soft.  Lymphadenopathy:     Cervical: No cervical adenopathy.  Skin:    General: Skin is warm and dry.     Capillary Refill: Capillary refill takes less than 2 seconds.  Neurological:     Mental Status: He is alert and oriented to person, place, and time.  Psychiatric:        Behavior: Behavior normal.      Musculoskeletal Exam: C-spine, thoracic spine, and lumbar spine good ROM.  No midline spinal tenderness.  No SI joint tenderness.  Shoulder joints, elbow joints, wrist joints, MCPs, PIPs, and DIPs good ROM. Right 1st Dip joint tenderness and synovitis.  Synovial thickening of all DIP joints. Hip joints, knee joints, ankle joints, MTPs, PIPs, and DIPs good ROM with no synovitis.  No warmth or effusion of knee joints.  No tenderness or swelling of ankle joints.  No achilles tendonitis or plantar fasciitis.   CDAI Exam: CDAI Score: - Patient Global: -; Provider Global: - Swollen: 1 ; Tender: 1  Joint Exam      Right  Left  DIP 2  Swollen Tender        Investigation: No additional findings.  Imaging: No results found.  Recent Labs: Lab Results  Component Value Date   WBC 6.8 01/17/2018   HGB 13.4 01/17/2018   PLT 238 01/17/2018   NA 132 (L) 01/17/2018   K 5.1 01/17/2018   CL 97 (L) 01/17/2018   CO2 28 01/17/2018   GLUCOSE 93 01/17/2018   BUN 17 01/17/2018   CREATININE 1.04 01/17/2018   BILITOT 0.5 01/17/2018   ALKPHOS 51 07/18/2016   AST 27  01/17/2018   ALT 22 01/17/2018   PROT 6.6 01/17/2018   ALBUMIN 4.2 07/18/2016   CALCIUM 9.0 01/17/2018   GFRAA 93 01/17/2018   QFTBGOLDPLUS NEGATIVE 01/17/2018    Speciality Comments: No specialty comments available.  Procedures:  No procedures performed Allergies: Penicillins   Assessment / Plan:     Visit Diagnoses: Psoriatic arthritis ([place]) - Severe aggressive disease: He continues to have active synovitis and tenderness of the right 1st DIP joint.  He has synovial thickening of all DIP joints.  He is clinically doing well on Cosentyx 300 mg sq every 28 days, [name] 20 mg sq every week, and folic acid 2 mg po daily.  He has not missed any doses recently.  He has no SI joint tenderness.  He has no  achilles tendonitis or plantar fasciitis.  He has not had any recent flares.  He continues to stay active and walks, bikes, and performs at home workouts.  He was advised to notify us if he develops increased joint pain or joint swelling.  He will follow up in 5 months.    High risk medication use - [name] 20 mg subcutaneous every week, folic acid 2 mg by mouth daily, Cosentyx subcutaneous every month (May 2018) (Humira caused side effects).   Last TB gold negative on 01/17/2018 and will monitor yearly.  Most recent CBC/CMP stable on 01/17/2018.  He is overdue for labs.  CBC/CMP ordered for today and will monitor every 3 months.  Standing orders are in place.  He received his flu vaccine in October and previously Pneumovax 23.  - Plan: CBC with Differential/Platelet, COMPLETE METABOLIC PANEL WITH GFR  Other medical conditions are listed as follows:   Prosthetic eye globe - left  Dyslipidemia   Orders: Orders Placed This Encounter  Procedures  . CBC with Differential/Platelet  . COMPLETE METABOLIC PANEL WITH GFR   No orders of the defined types were placed in this encounter.   Follow-Up Instructions: Return in about 5 months (around 12/16/2018) for Psoriatic arthritis.   Ofilia Neas, PA-C  Note - This record has been created using Dragon software.  Chart creation errors have been sought, but may not always  have been located. Such creation errors do not reflect on  the standard of medical care.

## 2018-07-16 ENCOUNTER — Encounter: Payer: Self-pay | Admitting: Physician Assistant

## 2018-07-16 ENCOUNTER — Other Ambulatory Visit: Payer: Self-pay

## 2018-07-16 ENCOUNTER — Ambulatory Visit (INDEPENDENT_AMBULATORY_CARE_PROVIDER_SITE_OTHER): Payer: BC Managed Care – PPO | Admitting: Physician Assistant

## 2018-07-16 VITALS — BP 139/77 | HR 51 | Resp 12 | Ht 74.0 in | Wt 186.0 lb

## 2018-07-16 DIAGNOSIS — Z970 Presence of artificial eye: Secondary | ICD-10-CM | POA: Diagnosis not present

## 2018-07-16 DIAGNOSIS — L4050 Arthropathic psoriasis, unspecified: Secondary | ICD-10-CM

## 2018-07-16 DIAGNOSIS — Z79899 Other long term (current) drug therapy: Secondary | ICD-10-CM

## 2018-07-16 DIAGNOSIS — E785 Hyperlipidemia, unspecified: Secondary | ICD-10-CM | POA: Diagnosis not present

## 2018-07-17 ENCOUNTER — Other Ambulatory Visit: Payer: Self-pay

## 2018-07-17 DIAGNOSIS — Z79899 Other long term (current) drug therapy: Secondary | ICD-10-CM

## 2018-07-17 LAB — CBC WITH DIFFERENTIAL/PLATELET
Absolute Monocytes: 315 cells/uL (ref 200–950)
Basophils Absolute: 11 cells/uL (ref 0–200)
Basophils Relative: 0.3 %
Eosinophils Absolute: 70 cells/uL (ref 15–500)
Eosinophils Relative: 2 %
HCT: 43 % (ref 38.5–50.0)
Hemoglobin: 14.4 g/dL (ref 13.2–17.1)
Lymphs Abs: 998 cells/uL (ref 850–3900)
MCH: 31.2 pg (ref 27.0–33.0)
MCHC: 33.5 g/dL (ref 32.0–36.0)
MCV: 93.1 fL (ref 80.0–100.0)
MPV: 10.4 fL (ref 7.5–12.5)
Monocytes Relative: 9 %
Neutro Abs: 2107 cells/uL (ref 1500–7800)
Neutrophils Relative %: 60.2 %
Platelets: 227 10*3/uL (ref 140–400)
RBC: 4.62 10*6/uL (ref 4.20–5.80)
RDW: 13.3 % (ref 11.0–15.0)
Total Lymphocyte: 28.5 %
WBC: 3.5 10*3/uL — ABNORMAL LOW (ref 3.8–10.8)

## 2018-07-17 LAB — COMPLETE METABOLIC PANEL WITH GFR
AG Ratio: 2 (calc) (ref 1.0–2.5)
ALT: 20 U/L (ref 9–46)
AST: 24 U/L (ref 10–35)
Albumin: 4.5 g/dL (ref 3.6–5.1)
Alkaline phosphatase (APISO): 51 U/L (ref 35–144)
BUN: 16 mg/dL (ref 7–25)
CO2: 28 mmol/L (ref 20–32)
Calcium: 9.1 mg/dL (ref 8.6–10.3)
Chloride: 102 mmol/L (ref 98–110)
Creat: 1.15 mg/dL (ref 0.70–1.33)
GFR, Est African American: 82 mL/min/{1.73_m2} (ref >=60)
GFR, Est Non African American: 71 mL/min/{1.73_m2} (ref >=60)
Globulin: 2.2 g/dL (calc) (ref 1.9–3.7)
Glucose, Bld: 77 mg/dL (ref 65–99)
Potassium: 5 mmol/L (ref 3.5–5.3)
Sodium: 139 mmol/L (ref 135–146)
Total Bilirubin: 0.6 mg/dL (ref 0.2–1.2)
Total Protein: 6.7 g/dL (ref 6.1–8.1)

## 2018-07-17 NOTE — Progress Notes (Signed)
WBC count is low.  Please advise patient to return in 1 month to recheck CBC.  Rest of lab work is WNL.

## 2018-08-07 ENCOUNTER — Other Ambulatory Visit: Payer: Self-pay | Admitting: Rheumatology

## 2018-08-07 NOTE — Telephone Encounter (Signed)
Last Visit: 07/16/18 Next Visit: 12/18/18  Okay to refill per Dr. Estanislado Pandy

## 2018-08-20 ENCOUNTER — Telehealth: Payer: Self-pay | Admitting: Pharmacist

## 2018-08-20 NOTE — Telephone Encounter (Signed)
Received a Prior Authorization request from [facility] for [place] fax. Authorization has been submitted to patient's insurance via Cover My Meds.  Required additional information and instructed to call help desk.  Filled out original fax PA request and faxed back.  Will send document to scan center. Will update once we receive a response.  Key: [id number] Phone # [phone] Fax # [phone]

## 2018-08-22 ENCOUNTER — Other Ambulatory Visit: Payer: Self-pay

## 2018-08-22 DIAGNOSIS — Z79899 Other long term (current) drug therapy: Secondary | ICD-10-CM | POA: Diagnosis not present

## 2018-08-23 LAB — CBC WITH DIFFERENTIAL/PLATELET
Absolute Monocytes: 486 cells/uL (ref 200–950)
Basophils Absolute: 19 cells/uL (ref 0–200)
Basophils Relative: 0.3 %
Eosinophils Absolute: 32 cells/uL (ref 15–500)
Eosinophils Relative: 0.5 %
HCT: 41.4 % (ref 38.5–50.0)
Hemoglobin: 14.8 g/dL (ref 13.2–17.1)
Lymphs Abs: 1587 cells/uL (ref 850–3900)
MCH: 31.8 pg (ref 27.0–33.0)
MCHC: 35.7 g/dL (ref 32.0–36.0)
MCV: 89 fL (ref 80.0–100.0)
MPV: 9.7 fL (ref 7.5–12.5)
Monocytes Relative: 7.6 %
Neutro Abs: 4275 cells/uL (ref 1500–7800)
Neutrophils Relative %: 66.8 %
Platelets: 220 10*3/uL (ref 140–400)
RBC: 4.65 10*6/uL (ref 4.20–5.80)
RDW: 13.5 % (ref 11.0–15.0)
Total Lymphocyte: 24.8 %
WBC: 6.4 10*3/uL (ref 3.8–10.8)

## 2018-08-23 LAB — COMPLETE METABOLIC PANEL WITH GFR
AG Ratio: 2.3 (calc) (ref 1.0–2.5)
ALT: 21 U/L (ref 9–46)
AST: 23 U/L (ref 10–35)
Albumin: 4.8 g/dL (ref 3.6–5.1)
Alkaline phosphatase (APISO): 45 U/L (ref 35–144)
BUN: 19 mg/dL (ref 7–25)
CO2: 26 mmol/L (ref 20–32)
Calcium: 9.4 mg/dL (ref 8.6–10.3)
Chloride: 99 mmol/L (ref 98–110)
Creat: 1.04 mg/dL (ref 0.70–1.33)
GFR, Est African American: 93 mL/min/{1.73_m2} (ref >=60)
GFR, Est Non African American: 80 mL/min/{1.73_m2} (ref >=60)
Globulin: 2.1 g/dL (calc) (ref 1.9–3.7)
Glucose, Bld: 98 mg/dL (ref 65–99)
Potassium: 4.7 mmol/L (ref 3.5–5.3)
Sodium: 133 mmol/L — ABNORMAL LOW (ref 135–146)
Total Bilirubin: 0.5 mg/dL (ref 0.2–1.2)
Total Protein: 6.9 g/dL (ref 6.1–8.1)

## 2018-08-23 NOTE — Telephone Encounter (Signed)
Received a fax from [employer] regarding a prior authorization for [place]. Authorization has been APPROVED from 08/22/2018 to 08/22/2019.   Will send document to scan center.  Authorization # [id number] JR Phone # [phone]

## 2018-09-11 ENCOUNTER — Other Ambulatory Visit: Payer: Self-pay | Admitting: Rheumatology

## 2018-09-11 NOTE — Telephone Encounter (Signed)
Last Visit: 07/16/18 Next Visit: 12/18/18 Labs: 08/22/18 CBC WNL. Sodium is low at 133. Rest of CMP WNL.   Okay to refill per Dr. Estanislado Pandy

## 2018-09-17 ENCOUNTER — Other Ambulatory Visit: Payer: Self-pay | Admitting: Rheumatology

## 2018-09-17 NOTE — Telephone Encounter (Signed)
Last Visit: 07/16/18 Next Visit: 12/18/18 Labs: 08/22/18 CBC WNL. Sodium is low at 133. Rest of CMP WNL. TB Gold: 01/17/18 Neg   Okay to refill per Dr. Estanislado Pandy

## 2018-10-18 ENCOUNTER — Other Ambulatory Visit: Payer: Self-pay

## 2018-10-18 DIAGNOSIS — R6889 Other general symptoms and signs: Secondary | ICD-10-CM | POA: Diagnosis not present

## 2018-10-18 DIAGNOSIS — Z20822 Contact with and (suspected) exposure to covid-19: Secondary | ICD-10-CM

## 2018-10-20 LAB — NOVEL CORONAVIRUS, NAA: SARS-CoV-2, NAA: NOT DETECTED

## 2018-12-04 NOTE — Progress Notes (Signed)
Office Visit Note  Patient: Jacob Pena             Date of Birth: 01-01-1961           MRN: [id number]             PCP: Denita Lung, MD Referring: Denita Lung, MD Visit Date: 12/18/2018 Occupation: @GUAROCC @  Subjective:  Pain in right index finger    History of Present Illness: Jacob Pena is a 58 y.o. male with history of psoriatic arthritis.  He is on cosentyx 300 mg sq injections every 28 days, [name] 20 mg sq injections every week, and folic acid 2 mg po daily.  He continues to have pain in the right 1st and 4th DIP joint.  He notices intermittent inflammation.  He denies any other joint pain or joint swelling.  He denies any achilles tendonitis or plantar fasciitis. Denies any SI joint pain. He denies any psoriasis at this time. He remains very active and exercises on a daily basis.    Activities of Daily Living:  Patient reports morning stiffness for 15-20  minutes.   Patient Denies nocturnal pain.  Difficulty dressing/grooming: Denies Difficulty climbing stairs: Denies Difficulty getting out of chair: Denies Difficulty using hands for taps, buttons, cutlery, and/or writing: Denies  Review of Systems  Constitutional: Positive for fatigue. Negative for night sweats.  HENT: Negative for mouth sores, mouth dryness and nose dryness.   Eyes: Negative for redness and dryness.  Respiratory: Negative for cough, hemoptysis, shortness of breath and difficulty breathing.   Cardiovascular: Negative for chest pain, palpitations, hypertension, irregular heartbeat and swelling in legs/feet.  Gastrointestinal: Negative for blood in stool, constipation and diarrhea.  Endocrine: Negative for increased urination.  Genitourinary: Negative for painful urination.  Musculoskeletal: Positive for arthralgias, joint pain and morning stiffness. Negative for joint swelling, myalgias, muscle weakness, muscle tenderness and myalgias.  Skin: Negative for color change, rash, hair loss,  nodules/bumps, skin tightness, ulcers and sensitivity to sunlight.  Allergic/Immunologic: Negative for susceptible to infections.  Neurological: Negative for dizziness, fainting, memory loss, night sweats and weakness.  Hematological: Negative for swollen glands.  Psychiatric/Behavioral: Negative for depressed mood and sleep disturbance. The patient is not nervous/anxious.     PMFS History:  Patient Active Problem List   Diagnosis Date Noted  . 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 Psoriatic arthritis ([place]) 01/08/2016  . Spondyloarthropathy 01/08/2016  . High risk medication use 01/08/2016  . Familial hypercholesterolemia 05/18/2011  . Dyslipidemia 10/07/2008    Past Medical History:  Diagnosis Date  . Anxiety   . Depression    per pt/ never had depression  . Hyperlipidemia   . Rheumatoid arthritis ([place])     Family History  Problem Relation Age of Onset  . Colon cancer Paternal Grandmother   . Diverticulitis Mother   . Breast cancer Mother   . Prostate cancer Father   . Esophageal cancer Neg Hx   . Rectal cancer Neg Hx   . Stomach cancer Neg Hx    Past Surgical History:  Procedure Laterality Date  . APPENDECTOMY    . COLONOSCOPY  06/22/2018  . EYE SURGERY     left eye/prosthetic eye  . STOMACH SURGERY  1994   floating ligament  . TONSILLECTOMY     Social History   Social History Narrative  . Not on file   Immunization History  Administered Date(s) Administered  . Influenza Split 10/30/2013, 11/30/2016  . Influenza,inj,Quad PF,6+ Mos 01/15/2016, 11/30/2016, 11/15/2017  .  Influenza-Unspecified 11/15/2017  . Pneumococcal Polysaccharide-23 11/17/2011  . Td 02/01/2007     Objective: Vital Signs: BP 120/71 (BP Location: Left Arm, Patient Position: Sitting, Cuff Size: Normal)   Pulse 74   Resp 14   Ht 6' 2.5" (1.892 m)   Wt 186 lb 6.4 oz (84.6 kg)   BMI 23.61 kg/m    Physical Exam Vitals signs and nursing note reviewed.  Constitutional:      Appearance: He  is well-developed.  HENT:     Head: Normocephalic and atraumatic.  Eyes:     Conjunctiva/sclera: Conjunctivae normal.     Pupils: Pupils are equal, round, and reactive to light.  Neck:     Musculoskeletal: Normal range of motion and neck supple.  Cardiovascular:     Rate and Rhythm: Normal rate and regular rhythm.     Heart sounds: Normal heart sounds.  Pulmonary:     Effort: Pulmonary effort is normal.     Breath sounds: Normal breath sounds.  Abdominal:     General: Bowel sounds are normal.     Palpations: Abdomen is soft.  Skin:    General: Skin is warm and dry.     Capillary Refill: Capillary refill takes less than 2 seconds.     Comments: No nail pitting noted. No psoriasis.   Neurological:     Mental Status: He is alert and oriented to person, place, and time.  Psychiatric:        Behavior: Behavior normal.      Musculoskeletal Exam: C-spine, thoracic spine, and lumbar spine good ROM. No midline spinal tenderness.  No SI joint tenderness.  Shoulder joints, elbow joints, wrist joints, MCPs, PIPs, and DIPs good ROM. Right 2nd DIP joint synovial thickening and inflammation. Right 4th DIP joint synovial thickening. Thickening of all DIP joints.  Hip joints, knee joints, ankle joints, MTPs, PIPs, and DIPs good ROM with no synovitis.  No warmth or effusion of knee joints.  No tenderness or swelling of ankle joints. No achilles tendonitis or plantar fasciitis.   CDAI Exam: CDAI Score: - Patient Global: -; Provider Global: - Swollen: 0 ; Tender: 2  Joint Exam      Right  Left  DIP 2   Tender     DIP 5   Tender        Investigation: No additional findings.  Imaging: No results found.  Recent Labs: Lab Results  Component Value Date   WBC 6.4 08/22/2018   HGB 14.8 08/22/2018   PLT 220 08/22/2018   NA 133 (L) 08/22/2018   K 4.7 08/22/2018   CL 99 08/22/2018   CO2 26 08/22/2018   GLUCOSE 98 08/22/2018   BUN 19 08/22/2018   CREATININE 1.04 08/22/2018   BILITOT  0.5 08/22/2018   ALKPHOS 51 07/18/2016   AST 23 08/22/2018   ALT 21 08/22/2018   PROT 6.9 08/22/2018   ALBUMIN 4.2 07/18/2016   CALCIUM 9.4 08/22/2018   GFRAA 93 08/22/2018   QFTBGOLDPLUS NEGATIVE 01/17/2018    Speciality Comments: No specialty comments available.  Procedures:  No procedures performed Allergies: Penicillins   Assessment / Plan:     Visit Diagnoses: Psoriatic arthritis ([place]) - Severe aggressive disease: He has ongoing tenderness and synovitis of the right 1st DIP joint.  Synovial thickening of all DIP joints.  He has no achilles tendonitis or plantar fasciitis.  No SI joint tenderness.  He has no other joint pain or joint swelling at this time.  He is  clinically doing well on Cosentyx 300 mg sq injections every 28 days, [name] 20 mg sq injections every 7 days, and folic acid 2 mg po daily.  He will continue on this current treatment regimen.  He does not need any refills at this time. He was advised to notify us if he develops increased joint pain or joint swelling.  He will follow up in 5 months.   High risk medication use -Cosentyx 300 mg every 28 days, [name] 20 mg every 7 days, folic acid 1 mg 2 tablets daily.  Last TB gold negative on 01/17/2018 and will monitor yearly.  Future TB gold order placed. Most recent CBC/CMP within normal limits on 08/22/2018.  Due for CB/CMP today and will monitor every 3 months. (started on Cosentyx in May 2018) (Humira caused side effects). - Plan: CBC, CMP, QuantiFERON-TB Gold Plus, QuantiFERON-TB Gold Plus  Other medical conditions are listed as follows:   Dyslipidemia  Prosthetic eye globe - Left  Familial hypercholesterolemia  Orders: Orders Placed This Encounter  Procedures  . CBC  . CMP  . QuantiFERON-TB Gold Plus   No orders of the defined types were placed in this encounter.     Follow-Up Instructions: Return in about 5 months (around 05/18/2019) for Psoriatic arthritis.   Ofilia Neas, PA-C  Note - This  record has been created using Dragon software.  Chart creation errors have been sought, but may not always  have been located. Such creation errors do not reflect on  the standard of medical care.

## 2018-12-17 ENCOUNTER — Other Ambulatory Visit: Payer: Self-pay

## 2018-12-17 DIAGNOSIS — Z20822 Contact with and (suspected) exposure to covid-19: Secondary | ICD-10-CM

## 2018-12-18 ENCOUNTER — Encounter: Payer: Self-pay | Admitting: Physician Assistant

## 2018-12-18 ENCOUNTER — Other Ambulatory Visit: Payer: Self-pay

## 2018-12-18 ENCOUNTER — Ambulatory Visit (INDEPENDENT_AMBULATORY_CARE_PROVIDER_SITE_OTHER): Payer: BC Managed Care – PPO | Admitting: Physician Assistant

## 2018-12-18 VITALS — BP 120/71 | HR 74 | Resp 14 | Ht 74.5 in | Wt 186.4 lb

## 2018-12-18 DIAGNOSIS — Z970 Presence of artificial eye: Secondary | ICD-10-CM | POA: Diagnosis not present

## 2018-12-18 DIAGNOSIS — E785 Hyperlipidemia, unspecified: Secondary | ICD-10-CM

## 2018-12-18 DIAGNOSIS — L4050 Arthropathic psoriasis, unspecified: Secondary | ICD-10-CM | POA: Diagnosis not present

## 2018-12-18 DIAGNOSIS — Z111 Encounter for screening for respiratory tuberculosis: Secondary | ICD-10-CM | POA: Diagnosis not present

## 2018-12-18 DIAGNOSIS — E7801 Familial hypercholesterolemia: Secondary | ICD-10-CM

## 2018-12-18 DIAGNOSIS — Z79899 Other long term (current) drug therapy: Secondary | ICD-10-CM

## 2018-12-19 LAB — NOVEL CORONAVIRUS, NAA: SARS-CoV-2, NAA: NOT DETECTED

## 2018-12-19 NOTE — Progress Notes (Signed)
CBC and CMP WNL

## 2018-12-21 LAB — CBC WITH DIFFERENTIAL/PLATELET
Absolute Monocytes: 356 cells/uL (ref 200–950)
Basophils Absolute: 18 cells/uL (ref 0–200)
Basophils Relative: 0.4 %
Eosinophils Absolute: 41 cells/uL (ref 15–500)
Eosinophils Relative: 0.9 %
HCT: 43 % (ref 38.5–50.0)
Hemoglobin: 14.3 g/dL (ref 13.2–17.1)
Lymphs Abs: 1386 cells/uL (ref 850–3900)
MCH: 30.2 pg (ref 27.0–33.0)
MCHC: 33.3 g/dL (ref 32.0–36.0)
MCV: 90.7 fL (ref 80.0–100.0)
MPV: 10 fL (ref 7.5–12.5)
Monocytes Relative: 7.9 %
Neutro Abs: 2700 cells/uL (ref 1500–7800)
Neutrophils Relative %: 60 %
Platelets: 245 10*3/uL (ref 140–400)
RBC: 4.74 10*6/uL (ref 4.20–5.80)
RDW: 12.8 % (ref 11.0–15.0)
Total Lymphocyte: 30.8 %
WBC: 4.5 10*3/uL (ref 3.8–10.8)

## 2018-12-21 LAB — COMPLETE METABOLIC PANEL WITH GFR
AG Ratio: 2.3 (calc) (ref 1.0–2.5)
ALT: 23 U/L (ref 9–46)
AST: 26 U/L (ref 10–35)
Albumin: 4.5 g/dL (ref 3.6–5.1)
Alkaline phosphatase (APISO): 49 U/L (ref 35–144)
BUN: 24 mg/dL (ref 7–25)
CO2: 24 mmol/L (ref 20–32)
Calcium: 9.2 mg/dL (ref 8.6–10.3)
Chloride: 103 mmol/L (ref 98–110)
Creat: 1.26 mg/dL (ref 0.70–1.33)
GFR, Est African American: 73 mL/min/{1.73_m2} (ref >=60)
GFR, Est Non African American: 63 mL/min/{1.73_m2} (ref >=60)
Globulin: 2 g/dL (calc) (ref 1.9–3.7)
Glucose, Bld: 71 mg/dL (ref 65–99)
Potassium: 4.6 mmol/L (ref 3.5–5.3)
Sodium: 136 mmol/L (ref 135–146)
Total Bilirubin: 0.4 mg/dL (ref 0.2–1.2)
Total Protein: 6.5 g/dL (ref 6.1–8.1)

## 2018-12-21 LAB — QUANTIFERON-TB GOLD PLUS
Mitogen-NIL: >10 IU/mL
NIL: 0.02 IU/mL
QuantiFERON-TB Gold Plus: NEGATIVE
TB1-NIL: 0.03 IU/mL
TB2-NIL: 0.03 IU/mL

## 2018-12-21 NOTE — Progress Notes (Signed)
TB gold negative

## 2019-01-02 ENCOUNTER — Telehealth: Payer: Self-pay | Admitting: Pharmacy Technician

## 2019-01-02 DIAGNOSIS — L4050 Arthropathic psoriasis, unspecified: Secondary | ICD-10-CM

## 2019-01-02 MED ORDER — RASUVO 20 MG/0.4ML ~~LOC~~ SOAJ: 20.0000 mg | SUBCUTANEOUS | 0 refills | Status: DC

## 2019-01-02 NOTE — Telephone Encounter (Signed)
Received a Prior Authorization request from [facility] for [place] . Completed form and faxed to office for MD signature.  Plan prefers Rasuvo.

## 2019-01-14 ENCOUNTER — Telehealth: Payer: Self-pay | Admitting: Rheumatology

## 2019-01-14 NOTE — Telephone Encounter (Signed)
[facility] left a voicemail stating patient's prior authorization for Rasuvo has been denied.

## 2019-01-22 ENCOUNTER — Encounter: Payer: Self-pay | Admitting: Pharmacist

## 2019-01-22 NOTE — Progress Notes (Unsigned)
Appeal Letter faxed to [place] [phone].

## 2019-01-22 NOTE — Telephone Encounter (Signed)
[facility] is working on appeal for patient.

## 2019-01-22 NOTE — Telephone Encounter (Signed)
Appeal faxed to [facility] and will update when we receive a response.  Fax # [phone]

## 2019-02-18 NOTE — Telephone Encounter (Signed)
Received notification from [facility] regarding a prior authorization for RASUVO. Authorization has been APPROVED from 02/16/2019 to 02/16/2020.   Will send document to scan center.   Mariella Saa, PharmD, [place], [facility] Pharmacist [phone]  02/18/2019 1:13 PM

## 2019-02-22 ENCOUNTER — Telehealth: Payer: Self-pay | Admitting: Rheumatology

## 2019-02-22 NOTE — Telephone Encounter (Signed)
Patient calling because [facility] is telling patient they do not have authorization for Rasuvo, but it seems medication was approved on 02/18/19. Can you call pharmacy for patient. He is hoping to medication mailed this weekend, so he will not miss a dose. Please call to advise patient.

## 2019-02-22 NOTE — Telephone Encounter (Signed)
Called [facility], rep processed claim and insurance paid with $150 copay. Questioned if patient had a copay card on file, rep did not see one. Called patient to advise, left message. Advised if he does not have a Rasuvo copay card that he can download online.   11:53 AM Beatriz Chancellor, CPhT

## 2019-03-31 ENCOUNTER — Other Ambulatory Visit: Payer: Self-pay | Admitting: Rheumatology

## 2019-04-01 NOTE — Telephone Encounter (Signed)
Last Visit: 12/18/18 Next Visit: 05/21/19  Okay to refill per Dr. Estanislado Pandy

## 2019-05-02 ENCOUNTER — Other Ambulatory Visit: Payer: Self-pay | Admitting: Rheumatology

## 2019-05-02 DIAGNOSIS — L4050 Arthropathic psoriasis, unspecified: Secondary | ICD-10-CM

## 2019-05-02 NOTE — Telephone Encounter (Signed)
Patient advised he is due to update labs. Patient states he is not in need of a refill at this time.

## 2019-05-10 NOTE — Progress Notes (Signed)
Office Visit Note  Patient: Jacob Pena             Date of Birth: Sep 09, 1960           MRN: [id number]             PCP: Denita Lung, MD Referring: Denita Lung, MD Visit Date: 05/21/2019 Occupation: @GUAROCC @  Subjective:  Discuss Rasuvo   History of Present Illness: Jacob Pena is a 59 y.o. male with history of psoriatic arthritis.  He is on cosentyx 300 mg sq injections once monthly and rasuvo 20 mg sq injections once weekly.  He continues to have intermittent pain and inflammation in the right 1st and 4th DIP joints.  He denies any achilles tendonitis or plantar fascitis.  He denies any SI joint pain.  He denies any psoriasis at this time.  He states when switching from otrexup to rasuvo the price increased substantially.    Activities of Daily Living:  Patient reports morning stiffness for 15 minutes.   Patient Reports nocturnal pain.  Difficulty dressing/grooming: Denies Difficulty climbing stairs: Denies Difficulty getting out of chair: Denies Difficulty using hands for taps, buttons, cutlery, and/or writing: Denies  Review of Systems  Constitutional: Negative for fatigue and night sweats.  HENT: Negative for mouth sores, mouth dryness and nose dryness.   Eyes: Negative for redness, itching and dryness.  Respiratory: Negative for shortness of breath and difficulty breathing.   Cardiovascular: Negative for chest pain, palpitations, hypertension, irregular heartbeat and swelling in legs/feet.  Gastrointestinal: Negative for blood in stool, constipation and diarrhea.  Endocrine: Negative for increased urination.  Genitourinary: Negative for difficulty urinating and painful urination.  Musculoskeletal: Positive for arthralgias, joint pain, joint swelling and morning stiffness. Negative for myalgias, muscle weakness, muscle tenderness and myalgias.  Skin: Negative for color change, rash, hair loss, nodules/bumps, redness, skin tightness, ulcers and sensitivity to  sunlight.  Allergic/Immunologic: Negative for susceptible to infections.  Neurological: Negative for dizziness, fainting, numbness, headaches, memory loss, night sweats and weakness.  Hematological: Negative for bruising/bleeding tendency and swollen glands.  Psychiatric/Behavioral: Negative for depressed mood, confusion and sleep disturbance. The patient is not nervous/anxious.     PMFS History:  Patient Active Problem List   Diagnosis Date Noted  . 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 Psoriatic arthritis ([place]) 01/08/2016  . Spondyloarthropathy 01/08/2016  . High risk medication use 01/08/2016  . Familial hypercholesterolemia 05/18/2011  . Dyslipidemia 10/07/2008    Past Medical History:  Diagnosis Date  . Anxiety   . Depression    per pt/ never had depression  . Hyperlipidemia   . Rheumatoid arthritis ([place])     Family History  Problem Relation Age of Onset  . Colon cancer Paternal Grandmother   . Diverticulitis Mother   . Breast cancer Mother   . Prostate cancer Father   . Dementia Father   . Esophageal cancer Neg Hx   . Rectal cancer Neg Hx   . Stomach cancer Neg Hx    Past Surgical History:  Procedure Laterality Date  . APPENDECTOMY    . COLONOSCOPY  06/22/2018  . EYE SURGERY     left eye/prosthetic eye  . STOMACH SURGERY  1994   floating ligament  . TONSILLECTOMY     Social History   Social History Narrative  . Not on file   Immunization History  Administered Date(s) Administered  . Influenza Split 10/30/2013, 11/30/2016  . Influenza,inj,Quad PF,6+ Mos 01/15/2016, 11/30/2016, 11/15/2017  . Influenza-Unspecified 11/15/2017  .  Pneumococcal Polysaccharide-23 11/17/2011  . Td 02/01/2007     Objective: Vital Signs: BP 125/82 (BP Location: Left Arm, Patient Position: Sitting, Cuff Size: Normal)   Pulse 73   Resp 14   Ht 6\' 2"  (1.88 m)   Wt 185 lb 6.4 oz (84.1 kg)   BMI 23.80 kg/m    Physical Exam Vitals and nursing note reviewed.  Constitutional:        Appearance: He is well-developed.  HENT:     Head: Normocephalic and atraumatic.  Eyes:     Conjunctiva/sclera: Conjunctivae normal.     Pupils: Pupils are equal, round, and reactive to light.  Pulmonary:     Effort: Pulmonary effort is normal.  Abdominal:     General: Bowel sounds are normal.     Palpations: Abdomen is soft.  Musculoskeletal:     Cervical back: Normal range of motion and neck supple.  Skin:    General: Skin is warm and dry.     Capillary Refill: Capillary refill takes less than 2 seconds.  Neurological:     Mental Status: He is alert and oriented to person, place, and time.  Psychiatric:        Behavior: Behavior normal.      Musculoskeletal Exam: C-spine, thoracic spine, and lumbar spine good ROM.  No midline spinal tenderness. No SI joint tenderness.  Shoulder joints, elbow joints, wrist joints, MCPs, PIPs, and DIPs good ROM with no synovitis.  Thickening of right 1st and 4th DIP joints.  Inflammation in the right 1st DIP joint. Hip joints, knee joints, ankle joints, MTPs, PIPs, and DIPs good ROM with no synovitis.  No warmth or effusion of knee joints.  No tenderness or swelling of ankle joints.  No achilles tendonitis or plantar fasciitis.    CDAI Exam: CDAI Score: -- Patient Global: --; Provider Global: -- Swollen: 1 ; Tender: 1  Joint Exam 05/21/2019      Right  Left  DIP 2  Swollen Tender        Investigation: No additional findings.  Imaging: No results found.  Recent Labs: Lab Results  Component Value Date   WBC 4.5 12/18/2018   HGB 14.3 12/18/2018   PLT 245 12/18/2018   NA 136 12/18/2018   K 4.6 12/18/2018   CL 103 12/18/2018   CO2 24 12/18/2018   GLUCOSE 71 12/18/2018   BUN 24 12/18/2018   CREATININE 1.26 12/18/2018   BILITOT 0.4 12/18/2018   ALKPHOS 51 07/18/2016   AST 26 12/18/2018   ALT 23 12/18/2018   PROT 6.5 12/18/2018   ALBUMIN 4.2 07/18/2016   CALCIUM 9.2 12/18/2018   GFRAA 73 12/18/2018   QFTBGOLDPLUS NEGATIVE  12/18/2018    Speciality Comments: No specialty comments available.  Procedures:  No procedures performed Allergies: Penicillins   Assessment / Plan:     Visit Diagnoses: Psoriatic arthritis [facility]): He has persistent tenderness, thickening, and inflammation in the right 1st DIP joint.  He experiences intermittent tenderness in the right 4th DIP joint.  He has no other joint pain or inflammation at this time.  He continues to exercise on a regular basis.  He has no Achilles denies or plantar fasciitis.  No SI joint tenderness.  He has no active psoriasis at this time.  He is clinically doing well on Cosentyx 300 mg sq injections once every 30 days, Rasuvo 20 mg sq once weekly, and folic acid 2 mg po daily.  He has not missed any doses recently.  According  to the patient since he had to switch from [place] to Rasuvo because of the medication has increased significantly.  We discussed switching to vial and syringe but he declined at this time.  We will switch him to oral methotrexate 8 tablets by mouth once weekly.  A new prescription for methotrexate was sent to the pharmacy today.  A refill folic acid was also sent.  He will discontinue injectable Rasuvo.  He will continue on Cosentyx as prescribed.  He was advised to notify us if he cannot tolerate taking oral methotrexate or develops increased joint pain or joint swelling.  He will follow-up in the office in 5 months.  High risk medication use - Cosentyx 300 mg sq injections every 30 days, MTX 8 tablets by mouth once weekly, folic acid 2 mg po daily.  TB gold negative on 12/18/2018.  We will monitor TB Gold yearly.  CBC and CMP are within normal limits on 12/18/2018.  He is due to update lab work today.  Orders were released.  He will return for lab work in July and every 3 months.  Standing orders are in place.  Other medical conditions are listed as follows:  Dyslipidemia  Prosthetic eye globe  Familial hypercholesterolemia  Orders: Orders  Placed This Encounter  Procedures  . CBC with Differential/Platelet  . COMPLETE METABOLIC PANEL WITH GFR   Meds ordered this encounter  Medications  . folic acid (FOLVITE) 1 MG tablet    Sig: Take 2 tablets (2 mg total) by mouth daily.    Dispense:  180 tablet    Refill:  3  . methotrexate (RHEUMATREX) 2.5 MG tablet    Sig: Take 8 tablets (20 mg total) by mouth once a week. Caution:Chemotherapy. Protect from light.    Dispense:  96 tablet    Refill:  0     Follow-Up Instructions: Return in about 5 months (around 10/21/2019) for Psoriatic arthritis.   Hazel Sams, PA-C  I examined and evaluated the patient with Hazel Sams PA.  Patient continues to have some inflammation in his right index finger DIP joint.  He has synovial thickening but none of the other joints are inflamed.  I offered cortisone injection but he would like to wait at this point.  He has had good response to rosuvastatin [name] combination but the cost of [name] has been too high for him.  We will switch him to oral methotrexate per his request.  The plan of care was discussed as noted above.  Bo Merino, MD  Note - This record has been created using Editor, commissioning.  Chart creation errors have been sought, but may not always  have been located. Such creation errors do not reflect on  the standard of medical care.

## 2019-05-21 ENCOUNTER — Encounter: Payer: Self-pay | Admitting: Physician Assistant

## 2019-05-21 ENCOUNTER — Ambulatory Visit (INDEPENDENT_AMBULATORY_CARE_PROVIDER_SITE_OTHER): Payer: BC Managed Care – PPO | Admitting: Rheumatology

## 2019-05-21 ENCOUNTER — Other Ambulatory Visit: Payer: Self-pay

## 2019-05-21 VITALS — BP 125/82 | HR 73 | Resp 14 | Ht 74.0 in | Wt 185.4 lb

## 2019-05-21 DIAGNOSIS — Z970 Presence of artificial eye: Secondary | ICD-10-CM | POA: Diagnosis not present

## 2019-05-21 DIAGNOSIS — Z79899 Other long term (current) drug therapy: Secondary | ICD-10-CM | POA: Diagnosis not present

## 2019-05-21 DIAGNOSIS — L4050 Arthropathic psoriasis, unspecified: Secondary | ICD-10-CM

## 2019-05-21 DIAGNOSIS — E7801 Familial hypercholesterolemia: Secondary | ICD-10-CM

## 2019-05-21 DIAGNOSIS — E785 Hyperlipidemia, unspecified: Secondary | ICD-10-CM | POA: Diagnosis not present

## 2019-05-21 LAB — CBC WITH DIFFERENTIAL/PLATELET
Absolute Monocytes: 585 cells/uL (ref 200–950)
Basophils Absolute: 24 cells/uL (ref 0–200)
Basophils Relative: 0.3 %
Eosinophils Absolute: 40 cells/uL (ref 15–500)
Eosinophils Relative: 0.5 %
HCT: 44.8 % (ref 38.5–50.0)
Hemoglobin: 14.6 g/dL (ref 13.2–17.1)
Lymphs Abs: 1296 cells/uL (ref 850–3900)
MCH: 30.4 pg (ref 27.0–33.0)
MCHC: 32.6 g/dL (ref 32.0–36.0)
MCV: 93.1 fL (ref 80.0–100.0)
MPV: 9.6 fL (ref 7.5–12.5)
Monocytes Relative: 7.4 %
Neutro Abs: 5957 cells/uL (ref 1500–7800)
Neutrophils Relative %: 75.4 %
Platelets: 248 10*3/uL (ref 140–400)
RBC: 4.81 10*6/uL (ref 4.20–5.80)
RDW: 13.2 % (ref 11.0–15.0)
Total Lymphocyte: 16.4 %
WBC: 7.9 10*3/uL (ref 3.8–10.8)

## 2019-05-21 LAB — COMPLETE METABOLIC PANEL WITH GFR
AG Ratio: 1.9 (calc) (ref 1.0–2.5)
ALT: 32 U/L (ref 9–46)
AST: 30 U/L (ref 10–35)
Albumin: 4.5 g/dL (ref 3.6–5.1)
Alkaline phosphatase (APISO): 51 U/L (ref 35–144)
BUN: 24 mg/dL (ref 7–25)
CO2: 26 mmol/L (ref 20–32)
Calcium: 9.2 mg/dL (ref 8.6–10.3)
Chloride: 104 mmol/L (ref 98–110)
Creat: 1.19 mg/dL (ref 0.70–1.33)
GFR, Est African American: 78 mL/min/{1.73_m2} (ref >=60)
GFR, Est Non African American: 67 mL/min/{1.73_m2} (ref >=60)
Globulin: 2.4 g/dL (calc) (ref 1.9–3.7)
Glucose, Bld: 95 mg/dL (ref 65–99)
Potassium: 4.9 mmol/L (ref 3.5–5.3)
Sodium: 139 mmol/L (ref 135–146)
Total Bilirubin: 0.8 mg/dL (ref 0.2–1.2)
Total Protein: 6.9 g/dL (ref 6.1–8.1)

## 2019-05-21 MED ORDER — METHOTREXATE 2.5 MG PO TABS: 20.0000 mg | ORAL_TABLET | ORAL | 0 refills | Status: DC

## 2019-05-21 MED ORDER — FOLIC ACID 1 MG PO TABS: 2.0000 mg | ORAL_TABLET | Freq: Every day | ORAL | 3 refills | Status: DC

## 2019-05-21 NOTE — Patient Instructions (Signed)
Standing Labs We placed an order today for your standing lab work.    Please come back and get your standing labs in July and every 3 months  We have open lab daily Monday through Thursday from 8:30-12:30 PM and 1:30-4:30 PM and Friday from 8:30-12:30 PM and 1:30-4:00 PM at the office of Dr. Shaili Deveshwar.   You may experience shorter wait times on Monday and Friday afternoons. The office is located at [address], [address], [place],  [address] No appointment is necessary.   Labs are drawn by [facility].  You may receive a bill from [facility] for your lab work.  If you wish to have your labs drawn at another location, please call the office 24 hours in advance to send orders.  If you have any questions regarding directions or hours of operation,  please call [phone].   Just as a reminder please drink plenty of water prior to coming for your lab work. Thanks!  

## 2019-05-22 NOTE — Progress Notes (Signed)
CBC and CMP WNL

## 2019-09-02 ENCOUNTER — Other Ambulatory Visit: Payer: Self-pay | Admitting: Rheumatology

## 2019-09-03 ENCOUNTER — Other Ambulatory Visit: Payer: Self-pay | Admitting: *Deleted

## 2019-09-03 DIAGNOSIS — Z79899 Other long term (current) drug therapy: Secondary | ICD-10-CM

## 2019-09-03 LAB — CBC WITH DIFFERENTIAL/PLATELET
Absolute Monocytes: 709 cells/uL (ref 200–950)
Basophils Absolute: 33 cells/uL (ref 0–200)
Basophils Relative: 0.5 %
Eosinophils Absolute: 72 cells/uL (ref 15–500)
Eosinophils Relative: 1.1 %
HCT: 41.3 % (ref 38.5–50.0)
Hemoglobin: 13.6 g/dL (ref 13.2–17.1)
Lymphs Abs: 2035 cells/uL (ref 850–3900)
MCH: 30.8 pg (ref 27.0–33.0)
MCHC: 32.9 g/dL (ref 32.0–36.0)
MCV: 93.4 fL (ref 80.0–100.0)
MPV: 9.8 fL (ref 7.5–12.5)
Monocytes Relative: 10.9 %
Neutro Abs: 3653 cells/uL (ref 1500–7800)
Neutrophils Relative %: 56.2 %
Platelets: 229 10*3/uL (ref 140–400)
RBC: 4.42 10*6/uL (ref 4.20–5.80)
RDW: 13.2 % (ref 11.0–15.0)
Total Lymphocyte: 31.3 %
WBC: 6.5 10*3/uL (ref 3.8–10.8)

## 2019-09-03 LAB — COMPLETE METABOLIC PANEL WITH GFR
AG Ratio: 2 (calc) (ref 1.0–2.5)
ALT: 31 U/L (ref 9–46)
AST: 32 U/L (ref 10–35)
Albumin: 4.3 g/dL (ref 3.6–5.1)
Alkaline phosphatase (APISO): 52 U/L (ref 35–144)
BUN: 24 mg/dL (ref 7–25)
CO2: 26 mmol/L (ref 20–32)
Calcium: 9.1 mg/dL (ref 8.6–10.3)
Chloride: 99 mmol/L (ref 98–110)
Creat: 1.17 mg/dL (ref 0.70–1.33)
GFR, Est African American: 80 mL/min/{1.73_m2} (ref >=60)
GFR, Est Non African American: 69 mL/min/{1.73_m2} (ref >=60)
Globulin: 2.2 g/dL (calc) (ref 1.9–3.7)
Glucose, Bld: 107 mg/dL — ABNORMAL HIGH (ref 65–99)
Potassium: 4.4 mmol/L (ref 3.5–5.3)
Sodium: 134 mmol/L — ABNORMAL LOW (ref 135–146)
Total Bilirubin: 0.4 mg/dL (ref 0.2–1.2)
Total Protein: 6.5 g/dL (ref 6.1–8.1)

## 2019-09-03 NOTE — Telephone Encounter (Signed)
Last Visit: 05/21/2019 Next Visit: 10/29/2019 Labs: 05/21/2019 CBC and CMP WNL.  TB Gold: 12/18/2018 Neg   Current Dose per office note 05/21/2019: Cosentyx 300 mg sq injections once every 30 days  YB:[id number] arthritis   Patient advised he is due for labs. Patient states he will try to come to the office for labs today. Patient states he does not need a refill at this time as he sill has medication on hand.

## 2019-09-16 ENCOUNTER — Other Ambulatory Visit: Payer: Self-pay | Admitting: Rheumatology

## 2019-09-16 NOTE — Telephone Encounter (Signed)
Last Visit: 05/21/2019 Next Visit: 10/29/2019 Labs: 09/03/2019 Glucose is 107. Sodium is borderline low. Rest of CMP WNL. CBC WNL.  TB Gold: 12/18/2018 Neg   Current Dose per office note on 05/21/2019: Cosentyx 300 mg sq injections once every 30 days  [id number] arthritis   Okay to refill Cosentyx?

## 2019-09-19 ENCOUNTER — Telehealth: Payer: Self-pay | Admitting: Pharmacy Technician

## 2019-09-19 NOTE — Telephone Encounter (Signed)
Submitted a Prior Authorization request to [facility] for [place] via fax. Will update once we receive a response.   PA# [id number]

## 2019-09-25 NOTE — Telephone Encounter (Signed)
Received notification from [facility] regarding a prior authorization for [place]. Authorization has been APPROVED from 09/20/2019 to 09/19/2020.   Authorization # [id number]  Mariella Saa, PharmD, BCACP, CPP Clinical Specialty Pharmacist (Rheumatology and Pulmonology)  09/25/2019 8:25 AM

## 2019-09-25 NOTE — Telephone Encounter (Signed)
Received notification from [facility] regarding a prior authorization for [place]. Authorization has been APPROVED from 09/20/19 to 09/19/20.   Authorization # [id number]

## 2019-10-15 NOTE — Progress Notes (Signed)
Office Visit Note  Patient: Jacob Pena             Date of Birth: 08-Jul-1960           MRN: [id number]             PCP: Denita Lung, MD Referring: Denita Lung, MD Visit Date: 10/29/2019 Occupation: @GUAROCC @  Subjective:  Medication Management (will update xrays at next visit. )   History of Present Illness: Jacob Pena is a 59 y.o. male with history of psoriatic arthritis.  He states he has been doing well on combination of Cosentyx and methotrexate.  He denies any increased joint pain or joint swelling.  He had no episodes of iritis, plantar fasciitis or Achilles tendinitis.  Working out on a regular basis.  He recently retired.  Activities of Daily Living:  Patient reports morning stiffness for 5-10  minutes.   Patient Denies nocturnal pain.  Difficulty dressing/grooming: Denies Difficulty climbing stairs: Denies Difficulty getting out of chair: Denies Difficulty using hands for taps, buttons, cutlery, and/or writing: Reports  Review of Systems  Constitutional: Negative for fatigue.  HENT: Negative for mouth sores, mouth dryness and nose dryness.   Eyes: Negative for itching and dryness.  Respiratory: Negative for shortness of breath and difficulty breathing.   Cardiovascular: Negative for chest pain and palpitations.  Gastrointestinal: Negative for blood in stool, constipation and diarrhea.  Endocrine: Negative for increased urination.  Genitourinary: Negative for difficulty urinating.  Musculoskeletal: Positive for arthralgias, joint pain and morning stiffness. Negative for joint swelling, myalgias, muscle tenderness and myalgias.  Skin: Negative for color change, rash and redness.  Allergic/Immunologic: Negative for susceptible to infections.  Neurological: Negative for dizziness, numbness, headaches, memory loss and weakness.  Hematological: Negative for bruising/bleeding tendency.  Psychiatric/Behavioral: Negative for confusion.    PMFS History:    Patient Active Problem List   Diagnosis Date Noted  . 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 Psoriatic arthritis ([place]) 01/08/2016  . Spondyloarthropathy 01/08/2016  . High risk medication use 01/08/2016  . Familial hypercholesterolemia 05/18/2011  . Dyslipidemia 10/07/2008    Past Medical History:  Diagnosis Date  . Anxiety   . Depression    per pt/ never had depression  . Hyperlipidemia   . Rheumatoid arthritis ([place])     Family History  Problem Relation Age of Onset  . Colon cancer Paternal Grandmother   . Diverticulitis Mother   . Breast cancer Mother   . Prostate cancer Father   . Dementia Father   . Esophageal cancer Neg Hx   . Rectal cancer Neg Hx   . Stomach cancer Neg Hx    Past Surgical History:  Procedure Laterality Date  . APPENDECTOMY    . COLONOSCOPY  06/22/2018  . EYE SURGERY     left eye/prosthetic eye  . STOMACH SURGERY  1994   floating ligament  . TONSILLECTOMY     Social History   Social History Narrative  . Not on file   Immunization History  Administered Date(s) Administered  . Influenza Split 10/30/2013, 11/30/2016  . Influenza,inj,Quad PF,6+ Mos 01/15/2016, 11/30/2016, 11/15/2017  . Influenza-Unspecified 11/15/2017  . Moderna SARS-COVID-2 Vaccination 04/04/2019, 05/02/2019  . Pneumococcal Polysaccharide-23 11/17/2011  . Td 02/01/2007     Objective: Vital Signs: BP 119/73 (BP Location: Left Arm, Patient Position: Sitting, Cuff Size: Normal)   Pulse 62   Resp 15   Ht 6' 2.5" (1.892 m)   Wt 185 lb 3.2 oz (84 kg)  BMI 23.46 kg/m    Physical Exam Vitals and nursing note reviewed.  Constitutional:      Appearance: He is well-developed.  HENT:     Head: Normocephalic and atraumatic.  Eyes:     Conjunctiva/sclera: Conjunctivae normal.     Pupils: Pupils are equal, round, and reactive to light.  Cardiovascular:     Rate and Rhythm: Normal rate and regular rhythm.     Heart sounds: Normal heart sounds.  Pulmonary:     Effort:  Pulmonary effort is normal.     Breath sounds: Normal breath sounds.  Abdominal:     General: Bowel sounds are normal.     Palpations: Abdomen is soft.  Musculoskeletal:     Cervical back: Normal range of motion and neck supple.  Skin:    General: Skin is warm and dry.     Capillary Refill: Capillary refill takes less than 2 seconds.  Neurological:     Mental Status: He is alert and oriented to person, place, and time.  Psychiatric:        Behavior: Behavior normal.      Musculoskeletal Exam: C-spine thoracic and lumbar spine were in good range of motion.  He had no SI joint tenderness.  Shoulder joints, elbow joints, wrist joints with good range of motion.  He has thickening of DIP joints and mild synovitis in the right index finger DIP joint.  Hip joints, knee joints, ankles, MTPs and PIPs in good range of motion with no synovitis.  CDAI Exam: CDAI Score: -- Patient Global: --; Provider Global: -- Swollen: --; Tender: -- Joint Exam 10/29/2019   No joint exam has been documented for this visit   There is currently no information documented on the homunculus. Go to the Rheumatology activity and complete the homunculus joint exam.  Investigation: No additional findings.  Imaging: No results found.  Recent Labs: Lab Results  Component Value Date   WBC 6.5 09/03/2019   HGB 13.6 09/03/2019   PLT 229 09/03/2019   NA 134 (L) 09/03/2019   K 4.4 09/03/2019   CL 99 09/03/2019   CO2 26 09/03/2019   GLUCOSE 107 (H) 09/03/2019   BUN 24 09/03/2019   CREATININE 1.17 09/03/2019   BILITOT 0.4 09/03/2019   ALKPHOS 51 07/18/2016   AST 32 09/03/2019   ALT 31 09/03/2019   PROT 6.5 09/03/2019   ALBUMIN 4.2 07/18/2016   CALCIUM 9.1 09/03/2019   GFRAA 80 09/03/2019   QFTBGOLDPLUS NEGATIVE 12/18/2018    Speciality Comments: No specialty comments available.  Procedures:  No procedures performed Allergies: Penicillins   Assessment / Plan:     Visit Diagnoses: Psoriatic  arthritis ([facility])-he is doing well with no increased joint inflammation.  He had mild synovitis in the right index finger DIP joint.  None of the other joints were swollen.  There is no history of plantar fasciitis or Achilles tendinitis.  High risk medication use - Cosentyx 300 mg sq injections every 30 days, MTX 8 tablets by mouth once weekly, folic acid 2 mg po daily.  Labs are stable.  We will continue to monitor labs every 3 months.  Dyslipidemia  Prosthetic eye globe  Familial hypercholesterolemia  Educated about COVID-19 virus infection-we had detailed discussion regarding the booster.  Instructions were placed in the AVS.  Use of mask, social distancing and hand hygiene was discussed.  Orders: No orders of the defined types were placed in this encounter.  No orders of the defined types were placed  in this encounter.    Follow-Up Instructions: Return in about 5 months (around 03/30/2020) for Psoriatic arthritis.   Bo Merino, MD  Note - This record has been created using Editor, commissioning.  Chart creation errors have been sought, but may not always  have been located. Such creation errors do not reflect on  the standard of medical care.

## 2019-10-24 ENCOUNTER — Ambulatory Visit: Payer: BC Managed Care – PPO | Admitting: Physician Assistant

## 2019-10-29 ENCOUNTER — Other Ambulatory Visit: Payer: Self-pay

## 2019-10-29 ENCOUNTER — Ambulatory Visit (INDEPENDENT_AMBULATORY_CARE_PROVIDER_SITE_OTHER): Payer: BC Managed Care – PPO | Admitting: Rheumatology

## 2019-10-29 ENCOUNTER — Encounter: Payer: Self-pay | Admitting: Physician Assistant

## 2019-10-29 VITALS — BP 119/73 | HR 62 | Resp 15 | Ht 74.5 in | Wt 185.2 lb

## 2019-10-29 DIAGNOSIS — L4050 Arthropathic psoriasis, unspecified: Secondary | ICD-10-CM

## 2019-10-29 DIAGNOSIS — E785 Hyperlipidemia, unspecified: Secondary | ICD-10-CM

## 2019-10-29 DIAGNOSIS — Z970 Presence of artificial eye: Secondary | ICD-10-CM

## 2019-10-29 DIAGNOSIS — Z79899 Other long term (current) drug therapy: Secondary | ICD-10-CM

## 2019-10-29 DIAGNOSIS — E7801 Familial hypercholesterolemia: Secondary | ICD-10-CM

## 2019-10-29 DIAGNOSIS — Z7189 Other specified counseling: Secondary | ICD-10-CM

## 2019-10-29 NOTE — Patient Instructions (Addendum)
COVID-19 vaccine recommendations:  ° °COVID-19 vaccine is recommended for everyone (unless you are allergic to a vaccine component), even if you are on a medication that suppresses your immune system.  ° °If you are on Methotrexate, Cellcept (mycophenolate), Rinvoq, Xeljanz, and Olumiant- hold the medication for 1 week after each vaccine. Hold Methotrexate for 2 weeks after the single dose COVID-19 vaccine.  ° °If you are on Orencia subcutaneous injection - hold medication one week prior to and one week after the first COVID-19 vaccine dose (only).  ° °If you are on Orencia IV infusions- time vaccination administration so that the first COVID-19 vaccination will occur four weeks after the infusion and postpone the subsequent infusion by one week.  ° °If you are on Cyclophosphamide or Rituxan infusions please contact your doctor prior to receiving the COVID-19 vaccine.  ° °Do not take Tylenol or any anti-inflammatory medications (NSAIDs) 24 hours prior to the COVID-19 vaccination.  ° °There is no direct evidence about the efficacy of the COVID-19 vaccine in individuals who are on medications that suppress the immune system.  ° °Even if you are fully vaccinated, and you are on any medications that suppress your immune system, please continue to wear a mask, maintain at least six feet social distance and practice hand hygiene.  ° °If you develop a COVID-19 infection, please contact your PCP or our office to determine if you need antibody infusion. ° °The booster vaccine is now available for immunocompromised patients. It is advised that if you had Pfizer vaccine you should get Pfizer booster.  If you had a Moderna vaccine then you should get a Moderna booster. Johnson and Johnson does not have a booster vaccine at this time. ° °Please see the following web sites for updated information.   ° °https://www.rheumatology.org/Portals/0/Files/COVID-19-Vaccination-Patient-Resources.pdf ° °https://www.rheumatology.org/About-Us/Newsroom/Press-Releases/ID/1159 ° °Standing Labs °We placed an order today for your standing lab work.  ° °Please have your standing labs drawn in November and every 3 months  ° °If possible, please have your labs drawn 2 weeks prior to your appointment so that the provider can discuss your results at your appointment. ° °We have open lab daily °Monday through Thursday from 8:30-12:30 PM and 1:30-4:30 PM and Friday from 8:30-12:30 PM and 1:30-4:00 PM °at the office of Dr. Acsa Estey, [facility].   °Please be advised, patients with office appointments requiring lab work will take precedents over walk-in lab work.  °If possible, please come for your lab work on Monday and Friday afternoons, as you may experience shorter wait times. °The office is located at [address], [address], [place],  [address] °No appointment is necessary.   °Labs are drawn by [facility]. Please bring your co-pay at the time of your lab draw.  You may receive a bill from [facility] for your lab work. ° °If you wish to have your labs drawn at another location, please call the office 24 hours in advance to send orders. ° °If you have any questions regarding directions or hours of operation,  °please call [phone].   °As a reminder, please drink plenty of water prior to coming for your lab work. Thanks! ° ° °

## 2019-11-06 ENCOUNTER — Other Ambulatory Visit: Payer: Self-pay | Admitting: Physician Assistant

## 2019-11-06 NOTE — Telephone Encounter (Signed)
Last Visit: 10/29/2019 Next Visit: 03/31/2020 Labs: 09/03/2019 Glucose is 107. Sodium is borderline low. Rest of CMP WNL. CBC WNL.  Current Dose per office note 10/29/2019: MTX 8 tablets by mouth once weekly DX: Psoriatic arthritis   Okay to refill per Dr. Estanislado Pandy

## 2020-02-03 ENCOUNTER — Other Ambulatory Visit: Payer: Self-pay | Admitting: Rheumatology

## 2020-02-06 ENCOUNTER — Encounter: Payer: Self-pay | Admitting: Rheumatology

## 2020-02-07 ENCOUNTER — Other Ambulatory Visit: Payer: Self-pay | Admitting: *Deleted

## 2020-02-07 DIAGNOSIS — Z9225 Personal history of immunosupression therapy: Secondary | ICD-10-CM | POA: Diagnosis not present

## 2020-02-07 DIAGNOSIS — Z79899 Other long term (current) drug therapy: Secondary | ICD-10-CM

## 2020-02-07 DIAGNOSIS — Z111 Encounter for screening for respiratory tuberculosis: Secondary | ICD-10-CM | POA: Diagnosis not present

## 2020-02-09 LAB — COMPLETE METABOLIC PANEL WITH GFR
AG Ratio: 2 (calc) (ref 1.0–2.5)
ALT: 35 U/L (ref 9–46)
AST: 30 U/L (ref 10–35)
Albumin: 4.6 g/dL (ref 3.6–5.1)
Alkaline phosphatase (APISO): 53 U/L (ref 35–144)
BUN/Creatinine Ratio: 22 (calc) (ref 6–22)
BUN: 28 mg/dL — ABNORMAL HIGH (ref 7–25)
CO2: 28 mmol/L (ref 20–32)
Calcium: 9.6 mg/dL (ref 8.6–10.3)
Chloride: 100 mmol/L (ref 98–110)
Creat: 1.27 mg/dL (ref 0.70–1.33)
GFR, Est African American: 72 mL/min/{1.73_m2} (ref >=60)
GFR, Est Non African American: 62 mL/min/{1.73_m2} (ref >=60)
Globulin: 2.3 g/dL (calc) (ref 1.9–3.7)
Glucose, Bld: 81 mg/dL (ref 65–99)
Potassium: 4.8 mmol/L (ref 3.5–5.3)
Sodium: 137 mmol/L (ref 135–146)
Total Bilirubin: 0.6 mg/dL (ref 0.2–1.2)
Total Protein: 6.9 g/dL (ref 6.1–8.1)

## 2020-02-09 LAB — CBC WITH DIFFERENTIAL/PLATELET
Absolute Monocytes: 573 cells/uL (ref 200–950)
Basophils Absolute: 41 cells/uL (ref 0–200)
Basophils Relative: 0.6 %
Eosinophils Absolute: 41 cells/uL (ref 15–500)
Eosinophils Relative: 0.6 %
HCT: 41.3 % (ref 38.5–50.0)
Hemoglobin: 13.9 g/dL (ref 13.2–17.1)
Lymphs Abs: 1746 cells/uL (ref 850–3900)
MCH: 30.9 pg (ref 27.0–33.0)
MCHC: 33.7 g/dL (ref 32.0–36.0)
MCV: 91.8 fL (ref 80.0–100.0)
MPV: 10.2 fL (ref 7.5–12.5)
Monocytes Relative: 8.3 %
Neutro Abs: 4499 cells/uL (ref 1500–7800)
Neutrophils Relative %: 65.2 %
Platelets: 253 10*3/uL (ref 140–400)
RBC: 4.5 10*6/uL (ref 4.20–5.80)
RDW: 13.3 % (ref 11.0–15.0)
Total Lymphocyte: 25.3 %
WBC: 6.9 10*3/uL (ref 3.8–10.8)

## 2020-02-09 LAB — QUANTIFERON-TB GOLD PLUS
Mitogen-NIL: 8.64 IU/mL
NIL: 0.02 IU/mL
QuantiFERON-TB Gold Plus: NEGATIVE
TB1-NIL: 0.01 IU/mL
TB2-NIL: 0.01 IU/mL

## 2020-02-09 NOTE — Progress Notes (Signed)
CBC and CMP are normal.

## 2020-02-10 MED ORDER — METHOTREXATE SODIUM 2.5 MG PO TABS: ORAL_TABLET | ORAL | 0 refills | Status: DC

## 2020-02-10 NOTE — Telephone Encounter (Signed)
Last Visit: 10/29/2019 Next Visit: 03/31/2020 Labs: 02/07/2020 CBC and CMP are normal.  Current Dose per office note 10/29/2019: MTX 8 tablets by mouth once weekly DX: Psoriatic arthritis   Okay to refill per Dr. Estanislado Pandy

## 2020-03-17 NOTE — Progress Notes (Signed)
Office Visit Note  Patient: Jacob Pena             Date of Birth: Dec 05, 1960           MRN: [id number]             PCP: Denita Lung, MD Referring: Denita Lung, MD Visit Date: 03/31/2020 Occupation: @GUAROCC @  Subjective:  Medication monitoring   History of Present Illness: Jacob Pena is a 60 y.o. male with history of psoriatic arthritis.  He is on cosentyx 300 mg sq injections every 28 days, methotrexate 8 tablets by mouth once weekly, and folic acid 2 mg by mouth daily. He has not missed any doses recently.  He denies any recent psoriatic arthritis flares.  He has chronic stiffness and tenderness in the right 1st DIP joint.  He denies any other joint pain or joint swelling.  He has not had any morning stiffness or nocturnal pain.  He continues to exercise twice a day 5 days a week and walks for exercise on the weekends.  He denies any achilles tendonitis or plantar fasciitis at this time.  He has occasional SI joint discomfort and stiffness, which is typically exacerbated by riding on an airplane.  He denies any active psoriasis at this time.   Activities of Daily Living:  Patient reports morning stiffness for 0 minutes.   Patient Denies nocturnal pain.  Difficulty dressing/grooming: Denies Difficulty climbing stairs: Denies Difficulty getting out of chair: Denies Difficulty using hands for taps, buttons, cutlery, and/or writing: Denies  Review of Systems  Constitutional: Negative for fatigue.  HENT: Negative for mouth sores, mouth dryness and nose dryness.   Eyes: Negative for pain, itching and dryness.  Respiratory: Negative for shortness of breath and difficulty breathing.   Cardiovascular: Negative for chest pain and palpitations.  Gastrointestinal: Negative for blood in stool, constipation and diarrhea.  Endocrine: Negative for increased urination.  Genitourinary: Negative for difficulty urinating and painful urination.  Musculoskeletal: Negative for  arthralgias, joint pain, joint swelling, myalgias, morning stiffness, muscle tenderness and myalgias.  Skin: Negative for color change, rash and redness.  Allergic/Immunologic: Negative for susceptible to infections.  Neurological: Negative for dizziness, numbness, headaches, memory loss and weakness.  Hematological: Negative for bruising/bleeding tendency.  Psychiatric/Behavioral: Negative for confusion.    PMFS History:  Patient Active Problem List   Diagnosis Date Noted  . 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 Psoriatic arthritis ([place]) 01/08/2016  . Spondyloarthropathy 01/08/2016  . High risk medication use 01/08/2016  . Familial hypercholesterolemia 05/18/2011  . Dyslipidemia 10/07/2008    Past Medical History:  Diagnosis Date  . Anxiety   . Depression    per pt/ never had depression  . Hyperlipidemia   . Rheumatoid arthritis ([place])     Family History  Problem Relation Age of Onset  . Colon cancer Paternal Grandmother   . Diverticulitis Mother   . Breast cancer Mother   . Prostate cancer Father   . Dementia Father   . Esophageal cancer Neg Hx   . Rectal cancer Neg Hx   . Stomach cancer Neg Hx    Past Surgical History:  Procedure Laterality Date  . APPENDECTOMY    . COLONOSCOPY  06/22/2018  . EYE SURGERY     left eye/prosthetic eye  . STOMACH SURGERY  1994   floating ligament  . TONSILLECTOMY     Social History   Social History Narrative  . Not on file   Immunization History  Administered Date(s)  Administered  . Influenza Split 10/30/2013, 11/30/2016  . Influenza,inj,Quad PF,6+ Mos 01/15/2016, 11/30/2016, 11/15/2017  . Influenza-Unspecified 11/15/2017  . Moderna Sars-Covid-2 Vaccination 04/04/2019, 05/02/2019, 11/21/2019  . Pneumococcal Polysaccharide-23 11/17/2011  . Td 02/01/2007     Objective: Vital Signs: BP 132/79 (BP Location: Left Arm, Patient Position: Sitting, Cuff Size: Normal)   Pulse (!) 57   Resp 16   Ht 6' 2.5" (1.892 m)   Wt 195 lb 8 oz  (88.7 kg)   BMI 24.76 kg/m    Physical Exam Vitals and nursing note reviewed.  Constitutional:      Appearance: He is well-developed and well-nourished.  HENT:     Head: Normocephalic and atraumatic.  Eyes:     Extraocular Movements: EOM normal.     Conjunctiva/sclera: Conjunctivae normal.     Pupils: Pupils are equal, round, and reactive to light.  Pulmonary:     Effort: Pulmonary effort is normal.  Abdominal:     Palpations: Abdomen is soft.  Musculoskeletal:     Cervical back: Normal range of motion and neck supple.  Skin:    General: Skin is warm and dry.     Capillary Refill: Capillary refill takes less than 2 seconds.  Neurological:     Mental Status: He is alert and oriented to person, place, and time.  Psychiatric:        Mood and Affect: Mood and affect normal.        Behavior: Behavior normal.      Musculoskeletal Exam: C-spine, thoracic spine, and lumbar spine good ROM with no discomfort.  No midline spinal tenderness or SI joint tenderness.  Shoulder joints, elbow joints, wrist joints, MCPs, PIPs, and DIPs good ROM with no synovitis.  Complete fist formation bilaterally.  Thickening of all PIP and DIP joints, most prominent in the right 1st and 4th DIPs.  Complete fist formation bilaterally.  Hip joints, knee joints, and ankle joints good ROM with no discomfort.  No tenderness to palpation over bilateral trochanteric bursa.  No warmth or effusion of knee joints.  No tenderness or swelling of ankle joints.  No calf tightness or tenderness.  No tenderness along the achilles tendons bilaterally.   CDAI Exam: CDAI Score: - Patient Global: -; Provider Global: - Swollen: -; Tender: - Joint Exam 03/31/2020   No joint exam has been documented for this visit   There is currently no information documented on the homunculus. Go to the Rheumatology activity and complete the homunculus joint exam.  Investigation: No additional findings.  Imaging: No results  found.  Recent Labs: Lab Results  Component Value Date   WBC 6.9 02/07/2020   HGB 13.9 02/07/2020   PLT 253 02/07/2020   NA 137 02/07/2020   K 4.8 02/07/2020   CL 100 02/07/2020   CO2 28 02/07/2020   GLUCOSE 81 02/07/2020   BUN 28 (H) 02/07/2020   CREATININE 1.27 02/07/2020   BILITOT 0.6 02/07/2020   ALKPHOS 51 07/18/2016   AST 30 02/07/2020   ALT 35 02/07/2020   PROT 6.9 02/07/2020   ALBUMIN 4.2 07/18/2016   CALCIUM 9.6 02/07/2020   GFRAA 72 02/07/2020   QFTBGOLDPLUS NEGATIVE 02/07/2020    Speciality Comments: No specialty comments available.  Procedures:  No procedures performed Allergies: Penicillins   Assessment / Plan:     Visit Diagnoses: Psoriatic arthritis ([place]): He has no synovitis or dactylitis on exam.  He has not had any recent psoriatic arthritis flares.  He is clinically doing well on  Cosentyx 300 mg subcutaneous injections every 28 days, methotrexate 8 tablets by mouth once weekly, and folic acid 2 mg by mouth daily.  He has not missed any doses of these medications recently.  He has not had any Achilles tendinitis or plantar fasciitis.  He has occasional SI discomfort and stiffness after sitting for prolonged periods of time especially when on the airplane.  He has no active psoriasis at this time.  He continues to exercise twice daily 5 days a week and walks on the weekend for exercise.  He has not had any morning stiffness or nocturnal pain.  No difficulty with ADLs.  He will continue on the current treatment regimen.  He does not need any refills at this time.  He was advised to notify us if he develops increased joint pain or joint swelling.  He will follow-up in the office in 5 months.  High risk medication use - Cosentyx 300 mg sq injections every 30 days, MTX 8 tablets by mouth once weekly, folic acid 2 mg po daily.  CBC and CMP were updated on 02/07/2020.  He will be due to update lab work in April and every 3 months to monitor for drug toxicity.  Standing  orders for CBC and CMP remain in place.  TB gold negative on 02/07/2020 and will continue to be monitored yearly. He has not had any recent infections. He has received 3 moderna covid-19 vaccine doses.  Discussed that ACR recommends receiving the fourth dose 5 months-6 months after his 3rd vaccine dose.  Advised to hold methotrexate for 1 week after the vaccine.  He was also advised to avoid taking Tylenol and NSAIDs 24 hours prior to the vaccine. He is aware that he is to hold methotrexate and Cosentyx if he develops signs or symptoms of an infection and to resume once the infection has completely cleared.  Familial hypercholesterolemia  Prosthetic eye globe  Orders: No orders of the defined types were placed in this encounter.  No orders of the defined types were placed in this encounter.    Follow-Up Instructions: Return in about 5 months (around 08/31/2020) for Psoriatic arthritis.   Ofilia Neas, PA-C  Note - This record has been created using Dragon software.  Chart creation errors have been sought, but may not always  have been located. Such creation errors do not reflect on  the standard of medical care.

## 2020-03-31 ENCOUNTER — Encounter: Payer: Self-pay | Admitting: Physician Assistant

## 2020-03-31 ENCOUNTER — Other Ambulatory Visit: Payer: Self-pay

## 2020-03-31 ENCOUNTER — Ambulatory Visit (INDEPENDENT_AMBULATORY_CARE_PROVIDER_SITE_OTHER): Payer: BC Managed Care – PPO | Admitting: Physician Assistant

## 2020-03-31 VITALS — BP 132/79 | HR 57 | Resp 16 | Ht 74.5 in | Wt 195.5 lb

## 2020-03-31 DIAGNOSIS — L4050 Arthropathic psoriasis, unspecified: Secondary | ICD-10-CM | POA: Diagnosis not present

## 2020-03-31 DIAGNOSIS — Z970 Presence of artificial eye: Secondary | ICD-10-CM | POA: Diagnosis not present

## 2020-03-31 DIAGNOSIS — Z79899 Other long term (current) drug therapy: Secondary | ICD-10-CM

## 2020-03-31 DIAGNOSIS — E7801 Familial hypercholesterolemia: Secondary | ICD-10-CM | POA: Diagnosis not present

## 2020-03-31 NOTE — Patient Instructions (Signed)
Standing Labs We placed an order today for your standing lab work.   Please have your standing labs drawn in April and every 3 months   If possible, please have your labs drawn 2 weeks prior to your appointment so that the provider can discuss your results at your appointment.  We have open lab daily Monday through Thursday from 1:30-4:30 PM and Friday from 1:30-4:00 PM at the office of Dr. Bo Merino, [facility].   Please be advised, all patients with office appointments requiring lab work will take precedents over walk-in lab work.  If possible, please come for your lab work on Monday and Friday afternoons, as you may experience shorter wait times. The office is located at [address] [place], [place], [place] [address] No appointment is necessary.   Labs are drawn by [facility]. Please bring your co-pay at the time of your lab draw.  You may receive a bill from [facility] for your lab work.  If you wish to have your labs drawn at another location, please call the office 24 hours in advance to send orders.  If you have any questions regarding directions or hours of operation,  please call [phone].   As a reminder, please drink plenty of water prior to coming for your lab work. Thanks!

## 2020-04-22 ENCOUNTER — Other Ambulatory Visit: Payer: Self-pay | Admitting: Physician Assistant

## 2020-04-22 NOTE — Telephone Encounter (Signed)
Next Visit: 09/02/2020  Last Visit: 03/31/2020  Last Fill: 09/16/2019  DX:AJOINOMVE arthritis   Current Dose per office note 03/31/2020, Cosentyx 300 mg sq injections every 30 days  Labs: 02/07/2020, CBC and CMP are normal.  TB Gold: 02/07/2020, TB gold negative  Okay to refill Cosentyx?

## 2020-05-05 ENCOUNTER — Other Ambulatory Visit: Payer: Self-pay | Admitting: Rheumatology

## 2020-05-05 NOTE — Telephone Encounter (Signed)
Next Visit: 09/02/2020  Last Visit: 03/31/2020  Last Fill: 02/10/2020  DX: Psoriatic arthritis   Current Dose per office note 03/31/2020, MTX 8 tablets by mouth once weekly  Labs: 02/07/2020, CBC and CMP are normal.  Okay to refill MTX?

## 2020-07-02 ENCOUNTER — Encounter: Payer: Self-pay | Admitting: Family Medicine

## 2020-07-02 ENCOUNTER — Other Ambulatory Visit: Payer: Self-pay

## 2020-07-02 ENCOUNTER — Ambulatory Visit (INDEPENDENT_AMBULATORY_CARE_PROVIDER_SITE_OTHER): Payer: BC Managed Care – PPO | Admitting: Family Medicine

## 2020-07-02 VITALS — BP 110/74 | HR 64 | Temp 96.0°F | Ht 74.5 in | Wt 188.4 lb

## 2020-07-02 DIAGNOSIS — Z0000 Encounter for general adult medical examination without abnormal findings: Secondary | ICD-10-CM

## 2020-07-02 DIAGNOSIS — E7801 Familial hypercholesterolemia: Secondary | ICD-10-CM | POA: Diagnosis not present

## 2020-07-02 DIAGNOSIS — Z970 Presence of artificial eye: Secondary | ICD-10-CM

## 2020-07-02 DIAGNOSIS — L4050 Arthropathic psoriasis, unspecified: Secondary | ICD-10-CM | POA: Diagnosis not present

## 2020-07-02 DIAGNOSIS — Z23 Encounter for immunization: Secondary | ICD-10-CM

## 2020-07-02 DIAGNOSIS — M47819 Spondylosis without myelopathy or radiculopathy, site unspecified: Secondary | ICD-10-CM

## 2020-07-02 DIAGNOSIS — Z1159 Encounter for screening for other viral diseases: Secondary | ICD-10-CM

## 2020-07-02 DIAGNOSIS — Z79899 Other long term (current) drug therapy: Secondary | ICD-10-CM

## 2020-07-02 MED ORDER — ATORVASTATIN CALCIUM 10 MG PO TABS: 10.0000 mg | ORAL_TABLET | Freq: Every day | ORAL | 3 refills | Status: DC

## 2020-07-02 NOTE — Progress Notes (Signed)
   Subjective:    Patient ID: Jacob Pena, male    DOB: 1960/07/31, 60 y.o.   MRN: [id number]  HPI He is here for complete exam after a several year absence.  He has been followed by rheumatology for his underlying psoriatic arthritis.  Presently he is on methotrexate and Cosentyx for this which seems to be controlling his symptoms.  He is also using folic acid as well as a good multivitamin.  He is on Lipitor does have a family history of hypercholesterolemia.  He ran out of the Lipitor approximately 2 weeks ago.  He is now retired.  He is in a relationship of over 2 years which seems to be going well.  Does not smoke and drinks minimally.  He is sexually active but having no difficulty with that.  Family and social history as well as health maintenance and immunizations was reviewed.   Review of Systems  All other systems reviewed and are negative.      Objective:   Physical Exam Alert and in no distress.  Absence of one eye is noted.  Prosthetic device present tympanic membranes and canals are normal. Pharyngeal area is normal. Neck is supple without adenopathy or thyromegaly. Cardiac exam shows a regular sinus rhythm without murmurs or gallops. Lungs are clear to auscultation. Abdominal exam shows no masses or tenderness with normal bowel sounds extremities show no obvious deformities or effusions.      Assessment & Plan:  Routine general medical examination at a health care facility - Plan: CBC with Differential/Platelet, Comprehensive metabolic panel, Lipid panel  Familial hypercholesterolemia - Plan: Lipid panel  Prosthetic eye globe  Psoriatic arthritis ([place]) - Plan: CBC with Differential/Platelet, Comprehensive metabolic panel  Spondyloarthropathy  High risk medication use - Plan: CBC with Differential/Platelet, Comprehensive metabolic panel  Need for vaccination against Streptococcus pneumoniae - Plan: Pneumococcal conjugate vaccine 13-valent  Need for Tdap vaccination -  Plan: Tdap vaccine greater than or equal to 7yo IM  Need for hepatitis C screening test - Plan: Hepatitis C antibody  His immunizations were updated.

## 2020-07-03 LAB — CBC WITH DIFFERENTIAL/PLATELET
Basophils Absolute: 0 10*3/uL (ref 0.0–0.2)
Basos: 0 %
EOS (ABSOLUTE): 0 10*3/uL (ref 0.0–0.4)
Eos: 0 %
Hematocrit: 42.3 % (ref 37.5–51.0)
Hemoglobin: 14.1 g/dL (ref 13.0–17.7)
Immature Grans (Abs): 0 10*3/uL (ref 0.0–0.1)
Immature Granulocytes: 0 %
Lymphocytes Absolute: 1.9 10*3/uL (ref 0.7–3.1)
Lymphs: 26 %
MCH: 31 pg (ref 26.6–33.0)
MCHC: 33.3 g/dL (ref 31.5–35.7)
MCV: 93 fL (ref 79–97)
Monocytes Absolute: 0.6 10*3/uL (ref 0.1–0.9)
Monocytes: 8 %
Neutrophils Absolute: 4.7 10*3/uL (ref 1.4–7.0)
Neutrophils: 66 %
Platelets: 242 10*3/uL (ref 150–450)
RBC: 4.55 x10E6/uL (ref 4.14–5.80)
RDW: 13.3 % (ref 11.6–15.4)
WBC: 7.3 10*3/uL (ref 3.4–10.8)

## 2020-07-03 LAB — COMPREHENSIVE METABOLIC PANEL
ALT: 26 IU/L (ref 0–44)
AST: 26 IU/L (ref 0–40)
Albumin/Globulin Ratio: 2.2 (ref 1.2–2.2)
Albumin: 4.7 g/dL (ref 3.8–4.9)
Alkaline Phosphatase: 58 IU/L (ref 44–121)
BUN/Creatinine Ratio: 18 (ref 9–20)
BUN: 20 mg/dL (ref 6–24)
Bilirubin Total: 0.4 mg/dL (ref 0.0–1.2)
CO2: 22 mmol/L (ref 20–29)
Calcium: 8.9 mg/dL (ref 8.7–10.2)
Chloride: 97 mmol/L (ref 96–106)
Creatinine, Ser: 1.13 mg/dL (ref 0.76–1.27)
Globulin, Total: 2.1 g/dL (ref 1.5–4.5)
Glucose: 91 mg/dL (ref 65–99)
Potassium: 4.8 mmol/L (ref 3.5–5.2)
Sodium: 134 mmol/L (ref 134–144)
Total Protein: 6.8 g/dL (ref 6.0–8.5)
eGFR: 75 mL/min/{1.73_m2} (ref >59)

## 2020-07-03 LAB — LIPID PANEL
Chol/HDL Ratio: 3.9 ratio (ref 0.0–5.0)
Cholesterol, Total: 242 mg/dL — ABNORMAL HIGH (ref 100–199)
HDL: 62 mg/dL (ref >39)
LDL Chol Calc (NIH): 164 mg/dL — ABNORMAL HIGH (ref 0–99)
Triglycerides: 92 mg/dL (ref 0–149)
VLDL Cholesterol Cal: 16 mg/dL (ref 5–40)

## 2020-07-03 LAB — HEPATITIS C ANTIBODY: Hep C Virus Ab: <0.1 s/co ratio (ref 0.0–0.9)

## 2020-07-17 ENCOUNTER — Other Ambulatory Visit: Payer: Self-pay | Admitting: Physician Assistant

## 2020-07-17 NOTE — Telephone Encounter (Signed)
Next Visit: 09/02/2020   Last Visit: 03/31/2020   Last Fill: 05/05/2020  DX: Psoriatic arthritis    Current Dose per office note 03/31/2020, MTX 8 tablets by mouth once weekly   Labs: 07/02/2020 CBC/CMP WNL  Okay to refill MTX?

## 2020-08-19 NOTE — Progress Notes (Signed)
Office Visit Note  Patient: Jacob Pena             Date of Birth: 05-Aug-1960           MRN: [id number]             PCP: Denita Lung, MD Referring: Denita Lung, MD Visit Date: 09/02/2020 Occupation: @GUAROCC @  Subjective:  Right shoulder pain   History of Present Illness: Jacob Pena is a 60 y.o. male with history of psoriatic arthritis.  He states he has been doing well on Cosentyx.  He denies any joint swelling.  He denies any history of plantar fasciitis, Achilles tendinitis or any rash.  He states recently he has been experiencing some discomfort in his left shoulder.  He has been working out on a regular basis.  He has an appointment with Dr. Tamera Punt today.  He has been doing some stretching exercises at home which are not helpful.  Activities of Daily Living:  Patient reports morning stiffness for 1-2 minutes.   Patient Reports nocturnal pain.  Difficulty dressing/grooming: Denies Difficulty climbing stairs: Denies Difficulty getting out of chair: Denies Difficulty using hands for taps, buttons, cutlery, and/or writing: Denies  Review of Systems  Constitutional:  Negative for fatigue.  HENT:  Positive for mouth sores. Negative for mouth dryness and nose dryness.   Eyes:  Negative for pain, itching and dryness.  Respiratory:  Negative for shortness of breath and difficulty breathing.   Cardiovascular:  Negative for chest pain and palpitations.  Gastrointestinal:  Negative for blood in stool, constipation and diarrhea.  Endocrine: Negative for increased urination.  Genitourinary:  Negative for difficulty urinating.  Musculoskeletal:  Positive for joint pain, joint pain and morning stiffness. Negative for joint swelling, myalgias, muscle tenderness and myalgias.  Skin:  Negative for color change, rash and redness.  Allergic/Immunologic: Negative for susceptible to infections.  Neurological:  Positive for numbness. Negative for dizziness, headaches, memory loss  and weakness.  Hematological:  Negative for bruising/bleeding tendency.  Psychiatric/Behavioral:  Positive for sleep disturbance. Negative for confusion.    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   Psoriatic arthritis ([place]) 01/08/2016   Spondyloarthropathy 01/08/2016   High risk medication use 01/08/2016   Familial hypercholesterolemia 05/18/2011    Past Medical History:  Diagnosis Date   Anxiety    Depression    per pt/ never had depression   Hyperlipidemia    Rheumatoid arthritis ([place])     Family History  Problem Relation Age of Onset   Colon cancer Paternal Grandmother    Diverticulitis Mother    Breast cancer Mother    Prostate cancer Father    Dementia Father    Esophageal cancer Neg Hx    Rectal cancer Neg Hx    Stomach cancer Neg Hx    Past Surgical History:  Procedure Laterality Date   APPENDECTOMY     COLONOSCOPY  06/22/2018   EYE SURGERY     left eye/prosthetic eye   STOMACH SURGERY  1994   floating ligament   TONSILLECTOMY     Social History   Social History Narrative   Not on file   Immunization History  Administered Date(s) Administered   Influenza Split 10/30/2013, 11/30/2016   Influenza,inj,Quad PF,6+ Mos 01/15/2016, 11/30/2016, 11/15/2017, 11/26/2018   Influenza-Unspecified 11/15/2017, 12/11/2018   Moderna Sars-Covid-2 Vaccination 04/04/2019, 05/02/2019, 11/21/2019, 04/21/2020   Pneumococcal Conjugate-13 07/02/2020   Pneumococcal Polysaccharide-23 11/17/2011  Td 02/01/2007   Tdap 07/02/2020     Objective: Vital Signs: BP 134/87 (BP Location: Left Arm, Patient Position: Sitting, Cuff Size: Normal)   Pulse (!) 54   Ht 6' 2.5" (1.892 m)   Wt 181 lb 9.6 oz (82.4 kg)   BMI 23.00 kg/m    Physical Exam Vitals and nursing note reviewed.  Constitutional:      Appearance: He is well-developed.  HENT:     Head: Normocephalic and atraumatic.  Eyes:     Conjunctiva/sclera:  Conjunctivae normal.     Pupils: Pupils are equal, round, and reactive to light.  Cardiovascular:     Rate and Rhythm: Normal rate and regular rhythm.     Heart sounds: Normal heart sounds.  Pulmonary:     Effort: Pulmonary effort is normal.     Breath sounds: Normal breath sounds.  Abdominal:     General: Bowel sounds are normal.     Palpations: Abdomen is soft.  Musculoskeletal:     Cervical back: Normal range of motion and neck supple.  Skin:    General: Skin is warm and dry.     Capillary Refill: Capillary refill takes less than 2 seconds.  Neurological:     Mental Status: He is alert and oriented to person, place, and time.  Psychiatric:        Behavior: Behavior normal.     Musculoskeletal Exam: C-spine thoracic and lumbar spine were in good range of motion.  He had no tenderness over SI joints.  Shoulder joints were in good range of motion.  He had discomfort in his left shoulder joint with internal rotation.  Elbow joints and wrist joints with good range of motion.  He had thickening of DIP joints with no synovitis today.  Hip joints, knee joints, ankles, MTPs and PIPs with good range of motion with no evidence of Achilles tendinitis or plantar fasciitis.  CDAI Exam: CDAI Score: -- Patient Global: --; Provider Global: -- Swollen: --; Tender: -- Joint Exam 09/02/2020   No joint exam has been documented for this visit   There is currently no information documented on the homunculus. Go to the Rheumatology activity and complete the homunculus joint exam.  Investigation: No additional findings.  Imaging: No results found.  Recent Labs: Lab Results  Component Value Date   WBC 7.3 07/02/2020   HGB 14.1 07/02/2020   PLT 242 07/02/2020   NA 134 07/02/2020   K 4.8 07/02/2020   CL 97 07/02/2020   CO2 22 07/02/2020   GLUCOSE 91 07/02/2020   BUN 20 07/02/2020   CREATININE 1.13 07/02/2020   BILITOT 0.4 07/02/2020   ALKPHOS 58 07/02/2020   AST 26 07/02/2020   ALT 26  07/02/2020   PROT 6.8 07/02/2020   ALBUMIN 4.7 07/02/2020   CALCIUM 8.9 07/02/2020   GFRAA 72 02/07/2020   QFTBGOLDPLUS NEGATIVE 02/07/2020    Speciality Comments: No specialty comments available.  Procedures:  No procedures performed Allergies: Penicillins   Assessment / Plan:     Visit Diagnoses: Psoriatic arthritis ([facility])-he had no synovitis on examination today.  He has been tolerating Cosentyx well without any problems.  We may consider cutting down on methotrexate at the next visit if he continues to do well.  High risk medication use - Cosentyx 300 mg sq injections every 30 days, MTX 8 tablets by mouth once weekly, folic acid 2 mg po daily.  Labs in June were normal which were reviewed with the patient.  We  will continue to monitor labs every 3 months.  Fecal was negative on February 07, 2020.  Chronic left shoulder pain-he works out on a regular basis and lifts weights.  He has been having discomfort in his left shoulder joint.  He tried some exercises at home which were not helpful.  He has an appointment with Dr. Tamera Punt today.  Familial hypercholesterolemia-increased risk of heart disease with psoriatic arthritis was discussed.  He does exercise on a regular basis.  I placed a handout in AVS regarding dietary modifications and exercises.  Prosthetic eye globe  Orders: No orders of the defined types were placed in this encounter.  No orders of the defined types were placed in this encounter.    Follow-Up Instructions: Return in about 5 months (around 02/02/2021) for Psoriatic arthritis.   Bo Merino, MD  Note - This record has been created using Editor, commissioning.  Chart creation errors have been sought, but may not always  have been located. Such creation errors do not reflect on  the standard of medical care.

## 2020-08-24 ENCOUNTER — Other Ambulatory Visit: Payer: Self-pay | Admitting: Physician Assistant

## 2020-08-24 NOTE — Telephone Encounter (Addendum)
Next Visit: 09/02/2020   Last Visit: 03/31/2020   Last Fill: 05/21/2019  DX: Psoriatic arthritis    Current Dose per office note [id number], folic acid 2 mg po daily  Per protocol, okay to refill per Dr. Estanislado Pandy

## 2020-09-02 ENCOUNTER — Other Ambulatory Visit: Payer: Self-pay

## 2020-09-02 ENCOUNTER — Encounter: Payer: Self-pay | Admitting: Rheumatology

## 2020-09-02 ENCOUNTER — Ambulatory Visit (INDEPENDENT_AMBULATORY_CARE_PROVIDER_SITE_OTHER): Payer: BC Managed Care – PPO | Admitting: Rheumatology

## 2020-09-02 VITALS — BP 134/87 | HR 54 | Ht 74.5 in | Wt 181.6 lb

## 2020-09-02 DIAGNOSIS — Z79899 Other long term (current) drug therapy: Secondary | ICD-10-CM

## 2020-09-02 DIAGNOSIS — E7801 Familial hypercholesterolemia: Secondary | ICD-10-CM | POA: Diagnosis not present

## 2020-09-02 DIAGNOSIS — M25512 Pain in left shoulder: Secondary | ICD-10-CM

## 2020-09-02 DIAGNOSIS — Z970 Presence of artificial eye: Secondary | ICD-10-CM

## 2020-09-02 DIAGNOSIS — L4050 Arthropathic psoriasis, unspecified: Secondary | ICD-10-CM | POA: Diagnosis not present

## 2020-09-02 DIAGNOSIS — M67912 Unspecified disorder of synovium and tendon, left shoulder: Secondary | ICD-10-CM | POA: Diagnosis not present

## 2020-09-02 DIAGNOSIS — G8929 Other chronic pain: Secondary | ICD-10-CM

## 2020-09-02 NOTE — Patient Instructions (Signed)
Standing Labs We placed an order today for your standing lab work.   Please have your standing labs drawn in September and every 3 months  If possible, please have your labs drawn 2 weeks prior to your appointment so that the provider can discuss your results at your appointment.  Please note that you may see your imaging and lab results in [place] before we have reviewed them. We may be awaiting multiple results to interpret others before contacting you. Please allow our office up to 72 hours to thoroughly review all of the results before contacting the office for clarification of your results.  We have open lab daily: Monday through Thursday from 1:30-4:30 PM and Friday from 1:30-4:00 PM at the office of Dr. Bo Merino, [facility].   Please be advised, all patients with office appointments requiring lab work will take precedent over walk-in lab work.  If possible, please come for your lab work on Monday and Friday afternoons, as you may experience shorter wait times. The office is located at [address] [place], [place], [place] [address] No appointment is necessary.   Labs are drawn by [facility]. Please bring your co-pay at the time of your lab draw.  You may receive a bill from [facility] for your lab work.  If you wish to have your labs drawn at another location, please call the office 24 hours in advance to send orders.  If you have any questions regarding directions or hours of operation,  please call [phone].   As a reminder, please drink plenty of water prior to coming for your lab work. Thanks!   Vaccines You are taking a medication(s) that can suppress your immune system.  The following immunizations are recommended: Flu annually Covid-19  Td/Tdap (tetanus, diphtheria, pertussis) every 10 years Pneumonia (Prevnar 15 then Pneumovax 23 at least 1 year apart.  Alternatively, can take Prevnar 20 without needing additional dose) Shingrix (after age 62): 2  doses from 4 weeks to 6 months apart  Please check with your PCP to make sure you are up to date.    If you test POSITIVE for COVID19 and have MILD to MODERATE symptoms: First, call your PCP if you would like to receive COVID19 treatment AND Hold your medications during the infection and for at least 1 week after your symptoms have resolved: Injectable medication (Benlysta, Cimzia, Cosentyx, Enbrel, Humira, Orencia, Remicade, Simponi, Stelara, Taltz, Tremfya) Methotrexate Leflunomide (Arava) Azathioprine Mycophenolate (Cellcept) Roma Kayser, or Rinvoq Otezla If you take Actemra or Kevzara, you DO NOT need to hold these for COVID19 infection.  If you test POSITIVE for COVID19 and have NO symptoms: First, call your PCP if you would like to receive COVID19 treatment AND Hold your medications for at least 10 days after the day that you tested positive Injectable medication (Benlysta, Cimzia, Cosentyx, Enbrel, Humira, Orencia, Remicade, Simponi, Stelara, Taltz, Tremfya) Methotrexate Leflunomide (Arava) Azathioprine Mycophenolate (Cellcept) Roma Kayser, or Rinvoq Otezla If you take Actemra or Kevzara, you DO NOT need to hold these for COVID19 infection.  If you have signs or symptoms of an infection or start antibiotics: First, call your PCP for workup of your infection. Hold your medication through the infection, until you complete your antibiotics, and until symptoms resolve if you take the following: Injectable medication (Actemra, Benlysta, Cimzia, Cosentyx, Enbrel, Humira, Kevzara, Orencia, Remicade, Simponi, Stelara, Taltz, Tremfya) Methotrexate Leflunomide (Arava) Mycophenolate (Cellcept) Roma Kayser, or Rinvoq  Heart Disease Prevention   Your inflammatory disease increases your risk  of heart disease which includes heart attack, stroke, atrial fibrillation (irregular heartbeats), high blood pressure, heart failure and atherosclerosis (plaque in the arteries).   It is important to reduce your risk by:   Keep blood pressure, cholesterol, and blood sugar at healthy levels   Smoking Cessation   Maintain a healthy weight  BMI 20-25   Eat a healthy diet  Plenty of fresh fruit, vegetables, and whole grains  Limit saturated fats, foods high in sodium, and added sugars  DASH and Mediterranean diet   Increase physical activity  Recommend moderate physically activity for 150 minutes per week/ 30 minutes a day for five days a week These can be broken up into three separate ten-minute sessions during the day.   Reduce Stress  Meditation, slow breathing exercises, yoga, coloring books  Dental visits twice a year

## 2020-09-03 ENCOUNTER — Ambulatory Visit: Payer: BC Managed Care – PPO | Admitting: Rheumatology

## 2020-09-07 ENCOUNTER — Telehealth: Payer: Self-pay | Admitting: Pharmacy Technician

## 2020-09-07 NOTE — Telephone Encounter (Signed)
Submitted a Prior Authorization request to [facility] for Jacob Pena via fax. Will update once we receive a response.  PA# [id number]

## 2020-09-10 NOTE — Telephone Encounter (Signed)
Received notification from [facility] regarding a prior authorization for [place]. Authorization has been APPROVED from 09/10/20 to 09/10/21.   Patient must fill through [facility]: [phone]  Authorization # [id number]

## 2020-09-21 DIAGNOSIS — M7542 Impingement syndrome of left shoulder: Secondary | ICD-10-CM | POA: Diagnosis not present

## 2020-09-23 DIAGNOSIS — M7542 Impingement syndrome of left shoulder: Secondary | ICD-10-CM | POA: Diagnosis not present

## 2020-09-28 DIAGNOSIS — M7542 Impingement syndrome of left shoulder: Secondary | ICD-10-CM | POA: Diagnosis not present

## 2020-10-16 ENCOUNTER — Other Ambulatory Visit: Payer: Self-pay | Admitting: Physician Assistant

## 2020-10-16 DIAGNOSIS — Z79899 Other long term (current) drug therapy: Secondary | ICD-10-CM

## 2020-10-16 NOTE — Telephone Encounter (Signed)
Next Visit: 02/03/2021  Last Visit: 09/02/2020  Last Fill: 07/17/2020  DX: Psoriatic arthritis   Current Dose per office note 09/02/2020: MTX 8 tablets by mouth once weekly  Labs: 07/02/2020 WNL  Patient advised he is due to update labs.   Okay to refill MTX?

## 2020-10-19 DIAGNOSIS — M67912 Unspecified disorder of synovium and tendon, left shoulder: Secondary | ICD-10-CM | POA: Diagnosis not present

## 2020-11-03 DIAGNOSIS — M25512 Pain in left shoulder: Secondary | ICD-10-CM | POA: Diagnosis not present

## 2020-11-09 DIAGNOSIS — M75122 Complete rotator cuff tear or rupture of left shoulder, not specified as traumatic: Secondary | ICD-10-CM | POA: Diagnosis not present

## 2020-11-15 ENCOUNTER — Encounter: Payer: Self-pay | Admitting: Family Medicine

## 2020-11-15 DIAGNOSIS — E7801 Familial hypercholesterolemia: Secondary | ICD-10-CM

## 2020-11-16 ENCOUNTER — Other Ambulatory Visit: Payer: Self-pay

## 2020-11-16 DIAGNOSIS — Z79899 Other long term (current) drug therapy: Secondary | ICD-10-CM

## 2020-11-16 MED ORDER — ATORVASTATIN CALCIUM 10 MG PO TABS: 10.0000 mg | ORAL_TABLET | Freq: Every day | ORAL | 0 refills | Status: DC

## 2020-11-17 ENCOUNTER — Other Ambulatory Visit: Payer: Self-pay | Admitting: Physician Assistant

## 2020-11-17 LAB — CBC WITH DIFFERENTIAL/PLATELET
Absolute Monocytes: 421 cells/uL (ref 200–950)
Basophils Absolute: 18 cells/uL (ref 0–200)
Basophils Relative: 0.3 %
Eosinophils Absolute: 61 cells/uL (ref 15–500)
Eosinophils Relative: 1 %
HCT: 42.2 % (ref 38.5–50.0)
Hemoglobin: 14 g/dL (ref 13.2–17.1)
Lymphs Abs: 2092 cells/uL (ref 850–3900)
MCH: 30.8 pg (ref 27.0–33.0)
MCHC: 33.2 g/dL (ref 32.0–36.0)
MCV: 93 fL (ref 80.0–100.0)
MPV: 9.5 fL (ref 7.5–12.5)
Monocytes Relative: 6.9 %
Neutro Abs: 3508 cells/uL (ref 1500–7800)
Neutrophils Relative %: 57.5 %
Platelets: 252 10*3/uL (ref 140–400)
RBC: 4.54 10*6/uL (ref 4.20–5.80)
RDW: 13.1 % (ref 11.0–15.0)
Total Lymphocyte: 34.3 %
WBC: 6.1 10*3/uL (ref 3.8–10.8)

## 2020-11-17 LAB — COMPLETE METABOLIC PANEL WITH GFR
AG Ratio: 2.1 (calc) (ref 1.0–2.5)
ALT: 40 U/L (ref 9–46)
AST: 33 U/L (ref 10–35)
Albumin: 4.5 g/dL (ref 3.6–5.1)
Alkaline phosphatase (APISO): 58 U/L (ref 35–144)
BUN: 18 mg/dL (ref 7–25)
CO2: 26 mmol/L (ref 20–32)
Calcium: 9.4 mg/dL (ref 8.6–10.3)
Chloride: 101 mmol/L (ref 98–110)
Creat: 1.12 mg/dL (ref 0.70–1.30)
Globulin: 2.1 g/dL (calc) (ref 1.9–3.7)
Glucose, Bld: 118 mg/dL — ABNORMAL HIGH (ref 65–99)
Potassium: 5.2 mmol/L (ref 3.5–5.3)
Sodium: 137 mmol/L (ref 135–146)
Total Bilirubin: 0.3 mg/dL (ref 0.2–1.2)
Total Protein: 6.6 g/dL (ref 6.1–8.1)
eGFR: 76 mL/min/{1.73_m2} (ref >=60)

## 2020-11-17 NOTE — Telephone Encounter (Addendum)
Next Visit: 02/03/2021   Last Visit: 09/02/2020   Last Fill: 07/17/2020   DX: Psoriatic arthritis    Current Dose per office note 09/02/2020: MTX 8 tablets by mouth once weekly   Labs: 11/16/2020 Glucose 118, all other labs WNL  Okay to refill MTX?

## 2020-11-17 NOTE — Progress Notes (Signed)
Glucose is 118.  Rest of CMP WNL. CBC WNL.

## 2020-11-23 ENCOUNTER — Other Ambulatory Visit: Payer: Self-pay | Admitting: Physician Assistant

## 2020-11-23 NOTE — Telephone Encounter (Signed)
Next Visit: 02/03/2021   Last Visit: 09/02/2020   Last Fill: 04/22/2020   DX: Psoriatic arthritis    Current Dose per office note 09/02/2020: Cosentyx 300 mg sq injections every 30 days   Labs: 11/16/2020 Glucose 118, all other labs WNL  TB Gold: 02/07/2020 Neg   Okay to refill MTX?

## 2021-01-04 DIAGNOSIS — M7542 Impingement syndrome of left shoulder: Secondary | ICD-10-CM | POA: Diagnosis not present

## 2021-01-04 DIAGNOSIS — G8918 Other acute postprocedural pain: Secondary | ICD-10-CM | POA: Diagnosis not present

## 2021-01-04 DIAGNOSIS — M75122 Complete rotator cuff tear or rupture of left shoulder, not specified as traumatic: Secondary | ICD-10-CM | POA: Diagnosis not present

## 2021-01-04 HISTORY — PX: ROTATOR CUFF REPAIR: SHX139

## 2021-01-13 DIAGNOSIS — M25612 Stiffness of left shoulder, not elsewhere classified: Secondary | ICD-10-CM | POA: Diagnosis not present

## 2021-01-13 DIAGNOSIS — M75122 Complete rotator cuff tear or rupture of left shoulder, not specified as traumatic: Secondary | ICD-10-CM | POA: Diagnosis not present

## 2021-01-18 DIAGNOSIS — M25612 Stiffness of left shoulder, not elsewhere classified: Secondary | ICD-10-CM | POA: Diagnosis not present

## 2021-01-18 DIAGNOSIS — M75122 Complete rotator cuff tear or rupture of left shoulder, not specified as traumatic: Secondary | ICD-10-CM | POA: Diagnosis not present

## 2021-01-20 NOTE — Progress Notes (Signed)
Office Visit Note  Patient: Jacob Pena             Date of Birth: 08-24-1960           MRN: [id number]             PCP: Denita Lung, MD Referring: Denita Lung, MD Visit Date: 02/03/2021 Occupation: @GUAROCC @  Subjective:  Medication monitoring  History of Present Illness: Dreux Mcgroarty is a 60 y.o. male with history of psoriatic arthritis.  He is on Cosentyx 300 mg subcutaneous injections every 30 days, methotrexate 8 tablets by mouth once weekly, and folic acid 2 mg daily.  Patient reports that he underwent left shoulder arthroscopic surgery by Dr. Barbra Sarks on 01/05/2019.  He has been going to physical therapy as advised and has noticed improvement in his range of motion and overall discomfort.  He held methotrexate for 1 week prior to surgery.  He did not have any signs or symptoms of a psoriatic arthritis flare during that time.  He states that he has backed off on his strength training since prior to surgery but has continued to work on cardio exercise.  He denies any active psoriasis at this time.  He has not had any SI joint discomfort.  He denies any plantar fasciitis or Achilles tendinitis.     Activities of Daily Living:  Patient reports morning stiffness for 0 minutes.   Patient Denies nocturnal pain.  Difficulty dressing/grooming: Denies Difficulty climbing stairs: Denies Difficulty getting out of chair: Denies Difficulty using hands for taps, buttons, cutlery, and/or writing: Denies  Review of Systems  Constitutional:  Negative for fatigue.  HENT:  Negative for mouth sores, mouth dryness and nose dryness.   Eyes:  Negative for pain, itching and dryness.  Respiratory:  Negative for shortness of breath and difficulty breathing.   Cardiovascular:  Negative for chest pain and palpitations.  Gastrointestinal:  Negative for blood in stool, constipation and diarrhea.  Endocrine: Negative for increased urination.  Genitourinary:  Negative for difficulty urinating.   Musculoskeletal:  Positive for joint swelling. Negative for joint pain, joint pain, myalgias, morning stiffness, muscle tenderness and myalgias.  Skin:  Negative for color change, rash and redness.  Allergic/Immunologic: Negative for susceptible to infections.  Neurological:  Negative for dizziness, numbness, headaches, memory loss and weakness.  Hematological:  Negative for bruising/bleeding tendency.  Psychiatric/Behavioral:  Negative for confusion.    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   Psoriatic arthritis ([place]) 01/08/2016   Spondyloarthropathy 01/08/2016   High risk medication use 01/08/2016   Familial hypercholesterolemia 05/18/2011    Past Medical History:  Diagnosis Date   Anxiety    Depression    per pt/ never had depression   Hyperlipidemia    Rheumatoid arthritis ([place])     Family History  Problem Relation Age of Onset   Colon cancer Paternal Grandmother    Diverticulitis Mother    Breast cancer Mother    Prostate cancer Father    Dementia Father    Esophageal cancer Neg Hx    Rectal cancer Neg Hx    Stomach cancer Neg Hx    Past Surgical History:  Procedure Laterality Date   APPENDECTOMY     COLONOSCOPY  06/22/2018   EYE SURGERY     left eye/prosthetic eye   ROTATOR CUFF REPAIR Left 01/04/2021   STOMACH SURGERY  1994   floating ligament   TONSILLECTOMY  Social History   Social History Narrative   Not on file   Immunization History  Administered Date(s) Administered   Influenza Split 10/30/2013, 11/30/2016   Influenza,inj,Quad PF,6+ Mos 01/15/2016, 11/30/2016, 11/15/2017, 11/26/2018   Influenza-Unspecified 11/15/2017, 12/11/2018, 09/18/2020   Moderna Sars-Covid-2 Vaccination 04/04/2019, 05/02/2019, 11/21/2019, 04/21/2020   Pneumococcal Conjugate-13 07/02/2020   Pneumococcal Polysaccharide-23 11/17/2011   Td 02/01/2007   Tdap 07/02/2020     Objective: Vital Signs: BP 133/77  (BP Location: Right Arm, Patient Position: Sitting, Cuff Size: Normal)    Pulse 61    Ht 6\' 2"  (1.88 m)    Wt 191 lb 12.8 oz (87 kg)    BMI 24.63 kg/m    Physical Exam Vitals and nursing note reviewed.  Constitutional:      Appearance: He is well-developed.  HENT:     Head: Normocephalic and atraumatic.  Eyes:     Conjunctiva/sclera: Conjunctivae normal.     Pupils: Pupils are equal, round, and reactive to light.  Pulmonary:     Effort: Pulmonary effort is normal.  Abdominal:     Palpations: Abdomen is soft.  Musculoskeletal:     Cervical back: Normal range of motion and neck supple.  Skin:    General: Skin is warm and dry.     Capillary Refill: Capillary refill takes less than 2 seconds.  Neurological:     Mental Status: He is alert and oriented to person, place, and time.  Psychiatric:        Behavior: Behavior normal.     Musculoskeletal Exam: C-spine has good range of motion with no discomfort.  No midline spinal tenderness or SI joint tenderness.  Right shoulder has full range of motion with no discomfort.  Left shoulder has limited active and passive range of motion.  Elbow joints have good range of motion with no tenderness or inflammation.  Wrist joints, MCPs, PIPs, DIPs have good range of motion with no synovitis.  PIP and DIP thickening and prominence noted bilaterally.  Complete fist formation noted.  Knee joints have good range of motion with no warmth or effusion.  Ankle joints have good range of motion with no tenderness or joint swelling.  No evidence of Achilles tendinitis.  CDAI Exam: CDAI Score: -- Patient Global: --; Provider Global: -- Swollen: --; Tender: -- Joint Exam 02/03/2021   No joint exam has been documented for this visit   There is currently no information documented on the homunculus. Go to the Rheumatology activity and complete the homunculus joint exam.  Investigation: No additional findings.  Imaging: No results found.  Recent Labs: Lab  Results  Component Value Date   WBC 6.1 11/16/2020   HGB 14.0 11/16/2020   PLT 252 11/16/2020   NA 137 11/16/2020   K 5.2 11/16/2020   CL 101 11/16/2020   CO2 26 11/16/2020   GLUCOSE 118 (H) 11/16/2020   BUN 18 11/16/2020   CREATININE 1.12 11/16/2020   BILITOT 0.3 11/16/2020   ALKPHOS 58 07/02/2020   AST 33 11/16/2020   ALT 40 11/16/2020   PROT 6.6 11/16/2020   ALBUMIN 4.7 07/02/2020   CALCIUM 9.4 11/16/2020   GFRAA 72 02/07/2020   QFTBGOLDPLUS NEGATIVE 02/07/2020    Speciality Comments: No specialty comments available.  Procedures:  No procedures performed Allergies: Penicillins   Assessment / Plan:     Visit Diagnoses: Psoriatic arthritis [facility]): He has no synovitis or dactylitis on examination.  He has not had any signs or symptoms of a psoriatic arthritis  flare.  He has no joint tenderness on examination today.  No evidence of Achilles tendinitis or plantar fasciitis.  He has no SI joint tenderness palpation.  He has no active psoriasis at this time.  He has clinically been doing well on Cosentyx 300 mg subcu injections every 30 days, methotrexate 8 tablets by mouth once weekly, and folic acid 2 mg daily.  He is tolerating these medications without any side effects.  He underwent left shoulder arthroscopic surgery on 01/04/2021 performed by Dr. Tamera Punt.  He held methotrexate 1 week prior to surgery.  He did not end up having to miss any doses of Cosentyx due to the timing of his procedure.  He he remains active working on cardio exercises at the gym and has been going to physical therapy as advised.  He will remain on the current treatment regimen.  He was advised to notify us if he develops signs or symptoms of a flare.  He will follow-up in the office in 5 months.  High risk medication use - Cosentyx 300 mg sq injections every 30 days, MTX 8 tablets by mouth once weekly, folic acid 2 mg po daily.  CBC and CMP drawn on 11/16/2020.  Due to update lab work today.  Orders for CBC  and CMP were released.  His next lab work will be due in April and every 3 months to monitor for drug toxicity.  Standing orders for CBC and CMP remain in place.  TB Gold negative on 02/07/2020.  Order for TB gold released today.- Plan: CBC with Differential/Platelet, COMPLETE METABOLIC PANEL WITH GFR, QuantiFERON-TB Gold Plus He has not had any recent infections. Discussed the importance of holding Cosentyx and methotrexate if he develops signs or symptoms of an infection and to resume once the infection has completely cleared.  Screening for tuberculosis - Order for TB gold released today. Plan: QuantiFERON-TB Gold Plus  S/P arthroscopy of left shoulder: Performed by Dr. Tamera Punt on 01/04/2021.  No complications or signs of infection.  He is going to physical therapy as advised.  His active and passive range of motion continue to improve.  Other medical conditions are listed as follows:   Familial hypercholesterolemia  Dyslipidemia  Prosthetic eye globe   Orders: Orders Placed This Encounter  Procedures   CBC with Differential/Platelet   COMPLETE METABOLIC PANEL WITH GFR   QuantiFERON-TB Gold Plus   No orders of the defined types were placed in this encounter.   Follow-Up Instructions: Return in about 5 months (around 07/04/2021) for Psoriatic arthritis.   Ofilia Neas, PA-C  Note - This record has been created using Dragon software.  Chart creation errors have been sought, but may not always  have been located. Such creation errors do not reflect on  the standard of medical care.

## 2021-01-21 DIAGNOSIS — M25612 Stiffness of left shoulder, not elsewhere classified: Secondary | ICD-10-CM | POA: Diagnosis not present

## 2021-01-21 DIAGNOSIS — M75122 Complete rotator cuff tear or rupture of left shoulder, not specified as traumatic: Secondary | ICD-10-CM | POA: Diagnosis not present

## 2021-01-27 DIAGNOSIS — M25612 Stiffness of left shoulder, not elsewhere classified: Secondary | ICD-10-CM | POA: Diagnosis not present

## 2021-01-27 DIAGNOSIS — M75122 Complete rotator cuff tear or rupture of left shoulder, not specified as traumatic: Secondary | ICD-10-CM | POA: Diagnosis not present

## 2021-02-02 DIAGNOSIS — M25612 Stiffness of left shoulder, not elsewhere classified: Secondary | ICD-10-CM | POA: Diagnosis not present

## 2021-02-02 DIAGNOSIS — M75122 Complete rotator cuff tear or rupture of left shoulder, not specified as traumatic: Secondary | ICD-10-CM | POA: Diagnosis not present

## 2021-02-03 ENCOUNTER — Encounter: Payer: Self-pay | Admitting: Physician Assistant

## 2021-02-03 ENCOUNTER — Other Ambulatory Visit: Payer: Self-pay

## 2021-02-03 ENCOUNTER — Ambulatory Visit (INDEPENDENT_AMBULATORY_CARE_PROVIDER_SITE_OTHER): Payer: BC Managed Care – PPO | Admitting: Physician Assistant

## 2021-02-03 VITALS — BP 133/77 | HR 61 | Ht 74.0 in | Wt 191.8 lb

## 2021-02-03 DIAGNOSIS — E7801 Familial hypercholesterolemia: Secondary | ICD-10-CM | POA: Diagnosis not present

## 2021-02-03 DIAGNOSIS — Z111 Encounter for screening for respiratory tuberculosis: Secondary | ICD-10-CM | POA: Diagnosis not present

## 2021-02-03 DIAGNOSIS — Z79899 Other long term (current) drug therapy: Secondary | ICD-10-CM | POA: Diagnosis not present

## 2021-02-03 DIAGNOSIS — Z9889 Other specified postprocedural states: Secondary | ICD-10-CM

## 2021-02-03 DIAGNOSIS — L4050 Arthropathic psoriasis, unspecified: Secondary | ICD-10-CM | POA: Diagnosis not present

## 2021-02-03 DIAGNOSIS — E785 Hyperlipidemia, unspecified: Secondary | ICD-10-CM

## 2021-02-03 DIAGNOSIS — Z970 Presence of artificial eye: Secondary | ICD-10-CM

## 2021-02-03 NOTE — Patient Instructions (Signed)
Standing Labs °We placed an order today for your standing lab work.  ° °Please have your standing labs drawn in April and every 3 months  ° °If possible, please have your labs drawn 2 weeks prior to your appointment so that the provider can discuss your results at your appointment. ° °Please note that you may see your imaging and lab results in MyChart before we have reviewed them. °We may be awaiting multiple results to interpret others before contacting you. °Please allow our office up to 72 hours to thoroughly review all of the results before contacting the office for clarification of your results. ° °We have open lab daily: °Monday through Thursday from 1:30-4:30 PM and Friday from 1:30-4:00 PM °at the office of Dr. Shaili Deveshwar, [facility].   °Please be advised, all patients with office appointments requiring lab work will take precedent over walk-in lab work.  °If possible, please come for your lab work on Monday and Friday afternoons, as you may experience shorter wait times. °The office is located at [address], [address], [place], [place] [address] °No appointment is necessary.   °Labs are drawn by [facility]. Please bring your co-pay at the time of your lab draw.  You may receive a bill from [facility] for your lab work. ° °If you wish to have your labs drawn at another location, please call the office 24 hours in advance to send orders. ° °If you have any questions regarding directions or hours of operation,  °please call [phone].   °As a reminder, please drink plenty of water prior to coming for your lab work. Thanks! ° °

## 2021-02-04 NOTE — Progress Notes (Signed)
CBC WNL.  BUN is borderline elevated-26, 25 is normal.  We will continue to monitor.  Creatinine is WNL.  GFR is WNL.  Rest of CMP WNL.

## 2021-02-05 LAB — QUANTIFERON-TB GOLD PLUS
Mitogen-NIL: >10 IU/mL
NIL: 0.05 IU/mL
QuantiFERON-TB Gold Plus: NEGATIVE
TB1-NIL: <0 IU/mL
TB2-NIL: 0 IU/mL

## 2021-02-05 LAB — COMPLETE METABOLIC PANEL WITH GFR
AG Ratio: 2 (calc) (ref 1.0–2.5)
ALT: 42 U/L (ref 9–46)
AST: 32 U/L (ref 10–35)
Albumin: 4.7 g/dL (ref 3.6–5.1)
Alkaline phosphatase (APISO): 60 U/L (ref 35–144)
BUN/Creatinine Ratio: 23 (calc) — ABNORMAL HIGH (ref 6–22)
BUN: 26 mg/dL — ABNORMAL HIGH (ref 7–25)
CO2: 26 mmol/L (ref 20–32)
Calcium: 9.4 mg/dL (ref 8.6–10.3)
Chloride: 101 mmol/L (ref 98–110)
Creat: 1.14 mg/dL (ref 0.70–1.30)
Globulin: 2.4 g/dL (calc) (ref 1.9–3.7)
Glucose, Bld: 83 mg/dL (ref 65–99)
Potassium: 4.9 mmol/L (ref 3.5–5.3)
Sodium: 136 mmol/L (ref 135–146)
Total Bilirubin: 0.4 mg/dL (ref 0.2–1.2)
Total Protein: 7.1 g/dL (ref 6.1–8.1)
eGFR: 75 mL/min/{1.73_m2} (ref >=60)

## 2021-02-05 LAB — CBC WITH DIFFERENTIAL/PLATELET
Absolute Monocytes: 418 cells/uL (ref 200–950)
Basophils Absolute: 28 cells/uL (ref 0–200)
Basophils Relative: 0.5 %
Eosinophils Absolute: 77 cells/uL (ref 15–500)
Eosinophils Relative: 1.4 %
HCT: 44 % (ref 38.5–50.0)
Hemoglobin: 14.6 g/dL (ref 13.2–17.1)
Lymphs Abs: 1876 cells/uL (ref 850–3900)
MCH: 30.9 pg (ref 27.0–33.0)
MCHC: 33.2 g/dL (ref 32.0–36.0)
MCV: 93.2 fL (ref 80.0–100.0)
MPV: 9.4 fL (ref 7.5–12.5)
Monocytes Relative: 7.6 %
Neutro Abs: 3102 cells/uL (ref 1500–7800)
Neutrophils Relative %: 56.4 %
Platelets: 274 10*3/uL (ref 140–400)
RBC: 4.72 10*6/uL (ref 4.20–5.80)
RDW: 13 % (ref 11.0–15.0)
Total Lymphocyte: 34.1 %
WBC: 5.5 10*3/uL (ref 3.8–10.8)

## 2021-02-08 ENCOUNTER — Other Ambulatory Visit: Payer: Self-pay | Admitting: Physician Assistant

## 2021-02-08 NOTE — Telephone Encounter (Signed)
Next Visit: 07/06/2021  Last Visit: 02/03/2021  Last Fill: 11/17/2020  DX: Psoriatic arthritis   Current Dose per office note 02/03/2021: MTX 8 tablets by mouth once weekly  Labs: 02/03/2021 CBC WNL.  BUN is borderline elevated-26, 25 is normal.  We will continue to monitor.  Creatinine is WNL.  GFR is WNL.  Rest of CMP WNL.    Okay to refill MTX?

## 2021-02-08 NOTE — Progress Notes (Signed)
TB gold negative

## 2021-02-09 ENCOUNTER — Other Ambulatory Visit: Payer: Self-pay | Admitting: Medical

## 2021-02-09 DIAGNOSIS — E7801 Familial hypercholesterolemia: Secondary | ICD-10-CM

## 2021-02-09 DIAGNOSIS — M25612 Stiffness of left shoulder, not elsewhere classified: Secondary | ICD-10-CM | POA: Diagnosis not present

## 2021-02-09 DIAGNOSIS — M75122 Complete rotator cuff tear or rupture of left shoulder, not specified as traumatic: Secondary | ICD-10-CM | POA: Diagnosis not present

## 2021-02-16 DIAGNOSIS — M75122 Complete rotator cuff tear or rupture of left shoulder, not specified as traumatic: Secondary | ICD-10-CM | POA: Diagnosis not present

## 2021-02-16 DIAGNOSIS — M25612 Stiffness of left shoulder, not elsewhere classified: Secondary | ICD-10-CM | POA: Diagnosis not present

## 2021-02-23 DIAGNOSIS — M25612 Stiffness of left shoulder, not elsewhere classified: Secondary | ICD-10-CM | POA: Diagnosis not present

## 2021-02-23 DIAGNOSIS — M75122 Complete rotator cuff tear or rupture of left shoulder, not specified as traumatic: Secondary | ICD-10-CM | POA: Diagnosis not present

## 2021-03-02 DIAGNOSIS — M75122 Complete rotator cuff tear or rupture of left shoulder, not specified as traumatic: Secondary | ICD-10-CM | POA: Diagnosis not present

## 2021-03-02 DIAGNOSIS — M25612 Stiffness of left shoulder, not elsewhere classified: Secondary | ICD-10-CM | POA: Diagnosis not present

## 2021-03-09 DIAGNOSIS — M25612 Stiffness of left shoulder, not elsewhere classified: Secondary | ICD-10-CM | POA: Diagnosis not present

## 2021-03-09 DIAGNOSIS — M75122 Complete rotator cuff tear or rupture of left shoulder, not specified as traumatic: Secondary | ICD-10-CM | POA: Diagnosis not present

## 2021-03-16 DIAGNOSIS — M25612 Stiffness of left shoulder, not elsewhere classified: Secondary | ICD-10-CM | POA: Diagnosis not present

## 2021-03-16 DIAGNOSIS — M75122 Complete rotator cuff tear or rupture of left shoulder, not specified as traumatic: Secondary | ICD-10-CM | POA: Diagnosis not present

## 2021-03-23 DIAGNOSIS — M25612 Stiffness of left shoulder, not elsewhere classified: Secondary | ICD-10-CM | POA: Diagnosis not present

## 2021-03-23 DIAGNOSIS — M75122 Complete rotator cuff tear or rupture of left shoulder, not specified as traumatic: Secondary | ICD-10-CM | POA: Diagnosis not present

## 2021-03-30 DIAGNOSIS — M75122 Complete rotator cuff tear or rupture of left shoulder, not specified as traumatic: Secondary | ICD-10-CM | POA: Diagnosis not present

## 2021-03-30 DIAGNOSIS — M25612 Stiffness of left shoulder, not elsewhere classified: Secondary | ICD-10-CM | POA: Diagnosis not present

## 2021-04-08 DIAGNOSIS — M25612 Stiffness of left shoulder, not elsewhere classified: Secondary | ICD-10-CM | POA: Diagnosis not present

## 2021-04-08 DIAGNOSIS — M75122 Complete rotator cuff tear or rupture of left shoulder, not specified as traumatic: Secondary | ICD-10-CM | POA: Diagnosis not present

## 2021-04-15 DIAGNOSIS — M75122 Complete rotator cuff tear or rupture of left shoulder, not specified as traumatic: Secondary | ICD-10-CM | POA: Diagnosis not present

## 2021-04-15 DIAGNOSIS — M25612 Stiffness of left shoulder, not elsewhere classified: Secondary | ICD-10-CM | POA: Diagnosis not present

## 2021-04-22 DIAGNOSIS — M25612 Stiffness of left shoulder, not elsewhere classified: Secondary | ICD-10-CM | POA: Diagnosis not present

## 2021-04-22 DIAGNOSIS — M75122 Complete rotator cuff tear or rupture of left shoulder, not specified as traumatic: Secondary | ICD-10-CM | POA: Diagnosis not present

## 2021-04-29 DIAGNOSIS — M75122 Complete rotator cuff tear or rupture of left shoulder, not specified as traumatic: Secondary | ICD-10-CM | POA: Diagnosis not present

## 2021-04-29 DIAGNOSIS — M25612 Stiffness of left shoulder, not elsewhere classified: Secondary | ICD-10-CM | POA: Diagnosis not present

## 2021-05-04 DIAGNOSIS — M75122 Complete rotator cuff tear or rupture of left shoulder, not specified as traumatic: Secondary | ICD-10-CM | POA: Diagnosis not present

## 2021-05-04 DIAGNOSIS — M25612 Stiffness of left shoulder, not elsewhere classified: Secondary | ICD-10-CM | POA: Diagnosis not present

## 2021-05-10 DIAGNOSIS — M25512 Pain in left shoulder: Secondary | ICD-10-CM | POA: Diagnosis not present

## 2021-06-23 NOTE — Progress Notes (Signed)
Office Visit Note  Patient: Jacob Pena             Date of Birth: February 17, 1960           MRN: [id number]             PCP: Denita Lung, MD Referring: Denita Lung, MD Visit Date: 07/06/2021 Occupation: '@GUAROCC'$ @  Subjective:  Medication management  History of Present Illness: Spenser Harren is a 61 y.o. male with history of psoriatic arthritis and psoriasis.  He states he has been doing well on the combination of Cosentyx 300 mg subcu weekly and methotrexate 8 tablets p.o. weekly.  He has not noticed any joint swelling.  He denies any history of uveitis, Achilles tendinitis or planter fasciitis.  He has been working out on a regular basis without any discomfort.  Activities of Daily Living:  Patient reports morning stiffness for 0  none .   Patient Denies nocturnal pain.  Difficulty dressing/grooming: Denies Difficulty climbing stairs: Denies Difficulty getting out of chair: Denies Difficulty using hands for taps, buttons, cutlery, and/or writing: Denies  Review of Systems  Constitutional:  Negative for fatigue.  HENT:  Negative for mouth dryness.   Eyes:  Negative for dryness.  Respiratory:  Negative for shortness of breath.   Cardiovascular:  Negative for swelling in legs/feet.  Gastrointestinal:  Negative for constipation.  Endocrine: Negative for excessive thirst.  Genitourinary:  Negative for difficulty urinating.  Musculoskeletal:  Negative for morning stiffness.  Skin:  Negative for rash.  Allergic/Immunologic: Negative for susceptible to infections.  Neurological:  Negative for weakness.  Hematological:  Negative for bruising/bleeding tendency.  Psychiatric/Behavioral:  Negative for sleep disturbance.    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   Psoriatic arthritis ([place]) 01/08/2016   Spondyloarthropathy 01/08/2016   High risk medication use 01/08/2016   Familial hypercholesterolemia 05/18/2011     Past Medical History:  Diagnosis Date   Anxiety    Depression    per pt/ never had depression   Hyperlipidemia    Rheumatoid arthritis ([place])     Family History  Problem Relation Age of Onset   Colon cancer Paternal Grandmother    Diverticulitis Mother    Breast cancer Mother    Prostate cancer Father    Dementia Father    Esophageal cancer Neg Hx    Rectal cancer Neg Hx    Stomach cancer Neg Hx    Past Surgical History:  Procedure Laterality Date   APPENDECTOMY     COLONOSCOPY  06/22/2018   EYE SURGERY     left eye/prosthetic eye   ROTATOR CUFF REPAIR Left 01/04/2021   STOMACH SURGERY  1994   floating ligament   TONSILLECTOMY     Social History   Social History Narrative   Not on file   Immunization History  Administered Date(s) Administered   Influenza Split 10/30/2013, 11/30/2016   Influenza,inj,Quad PF,6+ Mos 01/15/2016, 11/30/2016, 11/15/2017, 11/26/2018   Influenza-Unspecified 11/15/2017, 12/11/2018, 09/18/2020   Moderna Covid-19 Vaccine Bivalent Booster 25yr & up 10/20/2020   Moderna Sars-Covid-2 Vaccination 04/04/2019, 05/02/2019, 11/21/2019, 04/21/2020   Pneumococcal Conjugate-13 07/02/2020   Pneumococcal Polysaccharide-23 11/17/2011   Td 02/01/2007   Tdap 07/02/2020     Objective: Vital Signs: BP 115/61 (BP Location: Left Arm, Patient Position: Sitting, Cuff Size: Normal)   Pulse (!) 59   Resp 15   Ht '6\' 2"'$  (1.88 m)   Wt 182 lb (  82.6 kg)   BMI 23.37 kg/m    Physical Exam Vitals and nursing note reviewed.  Constitutional:      Appearance: He is well-developed.  HENT:     Head: Normocephalic and atraumatic.  Eyes:     Conjunctiva/sclera: Conjunctivae normal.     Pupils: Pupils are equal, round, and reactive to light.  Cardiovascular:     Rate and Rhythm: Normal rate and regular rhythm.     Heart sounds: Normal heart sounds.  Pulmonary:     Effort: Pulmonary effort is normal.     Breath sounds: Normal breath sounds.  Abdominal:      General: Bowel sounds are normal.     Palpations: Abdomen is soft.  Musculoskeletal:     Cervical back: Normal range of motion and neck supple.  Skin:    General: Skin is warm and dry.     Capillary Refill: Capillary refill takes less than 2 seconds.  Neurological:     Mental Status: He is alert and oriented to person, place, and time.  Psychiatric:        Behavior: Behavior normal.     Musculoskeletal Exam: C-spine thoracic and lumbar spine were in good range of motion.  There was no SI joint tenderness.  Shoulder joints, elbow joints, wrist joints, MCPs PIPs and DIPs with good range of motion.  He had bilateral PIP and DIP thickening due to underlying psoriatic arthritis and osteoarthritis overlap.  Hip joints and knee joints were in good range of motion.  There was no tenderness over Achilles tendon or plantar fascia.  There was no tenderness over ankles or MTPs.  CDAI Exam: CDAI Score: -- Patient Global: --; Provider Global: -- Swollen: --; Tender: -- Joint Exam 07/06/2021   No joint exam has been documented for this visit   There is currently no information documented on the homunculus. Go to the Rheumatology activity and complete the homunculus joint exam.  Investigation: No additional findings.  Imaging: No results found.  Recent Labs: Lab Results  Component Value Date   WBC 5.5 02/03/2021   HGB 14.6 02/03/2021   PLT 274 02/03/2021   NA 136 02/03/2021   K 4.9 02/03/2021   CL 101 02/03/2021   CO2 26 02/03/2021   GLUCOSE 83 02/03/2021   BUN 26 (H) 02/03/2021   CREATININE 1.14 02/03/2021   BILITOT 0.4 02/03/2021   ALKPHOS 58 07/02/2020   AST 32 02/03/2021   ALT 42 02/03/2021   PROT 7.1 02/03/2021   ALBUMIN 4.7 07/02/2020   CALCIUM 9.4 02/03/2021   GFRAA 72 02/07/2020   QFTBGOLDPLUS NEGATIVE 02/03/2021    Speciality Comments: No specialty comments available.  Procedures:  No procedures performed Allergies: Penicillins   Assessment / Plan:     Visit  Diagnoses: Psoriatic arthritis ([facility])-he has been doing well on the combination of Cosentyx and methotrexate.  He has been tolerating medications well.  He denies any joint pain or joint swelling.  He had no synovitis on examination.  There is no history of uveitis, Achilles tendinitis or planter fasciitis.  High risk medication use - Cosentyx 300 mg sq injections every 30 days, MTX 8 tablets by mouth once weekly, folic acid 2 mg po daily.  -Labs obtained on February 03, 2021 were within normal limits which were reviewed.  TB gold was negative on January 05/2021.  We will check labs today and every 3 months to monitor for drug toxicity.  Plan: CBC with Differential/Platelet, COMPLETE METABOLIC PANEL WITH GFR.  Information regarding immunization was placed in the AVS.  He was also advised to hold Cosentyx and methotrexate in case he develops an infection and resume after the infection resolves.  S/P arthroscopy of left shoulder - Performed by Dr. Tamera Punt on 01/04/2021 for RCT.  No complications or signs of infection.  He had good range of motion.  He has resumed exercises.  Familial hypercholesterolemia-he is on Lipitor.  Prosthetic eye globe  Orders: Orders Placed This Encounter  Procedures   CBC with Differential/Platelet   COMPLETE METABOLIC PANEL WITH GFR   No orders of the defined types were placed in this encounter.    Follow-Up Instructions: Return in about 5 months (around 12/06/2021) for Psoriatic arthritis.   Bo Merino, MD  Note - This record has been created using Editor, commissioning.  Chart creation errors have been sought, but may not always  have been located. Such creation errors do not reflect on  the standard of medical care.

## 2021-07-03 ENCOUNTER — Other Ambulatory Visit: Payer: Self-pay | Admitting: Physician Assistant

## 2021-07-04 NOTE — Telephone Encounter (Signed)
Next Visit: 07/06/2021  Last Visit: 02/03/2021  Last Fill: 02/08/2021  DX: Psoriatic arthritis   Current Dose per office note 02/03/2021: MTX 8 tablets by mouth once weekly  Labs: 02/03/2021, Patient advised CBC WNL.  BUN is borderline elevated-26, 25 is normal.  We will continue to monitor.  Creatinine is WNL.  GFR is WNL.  Rest of CMP WNL.   I will call patient to advise las are due.  Okay to refill MTX?

## 2021-07-06 ENCOUNTER — Encounter: Payer: Self-pay | Admitting: Rheumatology

## 2021-07-06 ENCOUNTER — Ambulatory Visit (INDEPENDENT_AMBULATORY_CARE_PROVIDER_SITE_OTHER): Payer: BC Managed Care – PPO | Admitting: Rheumatology

## 2021-07-06 VITALS — BP 115/61 | HR 59 | Resp 15 | Ht 74.0 in | Wt 182.0 lb

## 2021-07-06 DIAGNOSIS — Z970 Presence of artificial eye: Secondary | ICD-10-CM

## 2021-07-06 DIAGNOSIS — Z79899 Other long term (current) drug therapy: Secondary | ICD-10-CM | POA: Diagnosis not present

## 2021-07-06 DIAGNOSIS — E7801 Familial hypercholesterolemia: Secondary | ICD-10-CM | POA: Diagnosis not present

## 2021-07-06 DIAGNOSIS — L4050 Arthropathic psoriasis, unspecified: Secondary | ICD-10-CM | POA: Diagnosis not present

## 2021-07-06 DIAGNOSIS — Z9889 Other specified postprocedural states: Secondary | ICD-10-CM | POA: Diagnosis not present

## 2021-07-06 NOTE — Patient Instructions (Addendum)
Standing Labs We placed an order today for your standing lab work.   Please have your standing labs drawn in   If possible, please have your labs drawn 2 weeks prior to your appointment so that the provider can discuss your results at your appointment.  Please note that you may see your imaging and lab results in [facility] before we have reviewed them. We may be awaiting multiple results to interpret others before contacting you. Please allow our office up to 72 hours to thoroughly review all of the results before contacting the office for clarification of your results.  We have open lab daily: Monday through Thursday from 1:30-4:30 PM and Friday from 1:30-4:00 PM at the office of Dr. Bo Merino, [facility].   Please be advised, all patients with office appointments requiring lab work will take precedent over walk-in lab work.  If possible, please come for your lab work on Monday and Friday afternoons, as you may experience shorter wait times. The office is located at [address] [place], [place], [place] [address] No appointment is necessary.   Labs are drawn by [facility]. Please bring your co-pay at the time of your lab draw.  You may receive a bill from [facility] for your lab work.  Please note if you are on Hydroxychloroquine and and an order has been placed for a Hydroxychloroquine level, you will need to have it drawn 4 hours or more after your last dose.  If you wish to have your labs drawn at another location, please call the office 24 hours in advance to send orders.  If you have any questions regarding directions or hours of operation,  please call [phone].   As a reminder, please drink plenty of water prior to coming for your lab work. Thanks!   Vaccines You are taking a medication(s) that can suppress your immune system.  The following immunizations are recommended: Flu annually Covid-19  Td/Tdap (tetanus, diphtheria, pertussis) every 10  years Pneumonia (Prevnar 15 then Pneumovax 23 at least 1 year apart.  Alternatively, can take Prevnar 20 without needing additional dose) Shingrix: 2 doses from 4 weeks to 6 months apart  Please check with your PCP to make sure you are up to date.   If you have signs or symptoms of an infection or start antibiotics: First, call your PCP for workup of your infection. Hold your medication through the infection, until you complete your antibiotics, and until symptoms resolve if you take the following: Injectable medication (Actemra, Benlysta, Cimzia, Cosentyx, Enbrel, Humira, Kevzara, Orencia, Remicade, Simponi, Stelara, Taltz, Tremfya) Methotrexate Leflunomide (Arava) Mycophenolate (Cellcept) Morrie Sheldon, Olumiant, or Rinvoq

## 2021-07-07 LAB — COMPLETE METABOLIC PANEL WITH GFR
AG Ratio: 2.1 (calc) (ref 1.0–2.5)
ALT: 30 U/L (ref 9–46)
AST: 33 U/L (ref 10–35)
Albumin: 4.4 g/dL (ref 3.6–5.1)
Alkaline phosphatase (APISO): 53 U/L (ref 35–144)
BUN/Creatinine Ratio: 22 (calc) (ref 6–22)
BUN: 27 mg/dL — ABNORMAL HIGH (ref 7–25)
CO2: 25 mmol/L (ref 20–32)
Calcium: 9 mg/dL (ref 8.6–10.3)
Chloride: 101 mmol/L (ref 98–110)
Creat: 1.21 mg/dL (ref 0.70–1.30)
Globulin: 2.1 g/dL (calc) (ref 1.9–3.7)
Glucose, Bld: 85 mg/dL (ref 65–99)
Potassium: 4.5 mmol/L (ref 3.5–5.3)
Sodium: 135 mmol/L (ref 135–146)
Total Bilirubin: 0.7 mg/dL (ref 0.2–1.2)
Total Protein: 6.5 g/dL (ref 6.1–8.1)
eGFR: 69 mL/min/{1.73_m2} (ref >=60)

## 2021-07-07 LAB — CBC WITH DIFFERENTIAL/PLATELET
Absolute Monocytes: 423 cells/uL (ref 200–950)
Basophils Absolute: 17 cells/uL (ref 0–200)
Basophils Relative: 0.3 %
Eosinophils Absolute: 29 cells/uL (ref 15–500)
Eosinophils Relative: 0.5 %
HCT: 42.8 % (ref 38.5–50.0)
Hemoglobin: 14 g/dL (ref 13.2–17.1)
Lymphs Abs: 1665 cells/uL (ref 850–3900)
MCH: 31 pg (ref 27.0–33.0)
MCHC: 32.7 g/dL (ref 32.0–36.0)
MCV: 94.7 fL (ref 80.0–100.0)
MPV: 9.5 fL (ref 7.5–12.5)
Monocytes Relative: 7.3 %
Neutro Abs: 3666 cells/uL (ref 1500–7800)
Neutrophils Relative %: 63.2 %
Platelets: 233 10*3/uL (ref 140–400)
RBC: 4.52 10*6/uL (ref 4.20–5.80)
RDW: 13.1 % (ref 11.0–15.0)
Total Lymphocyte: 28.7 %
WBC: 5.8 10*3/uL (ref 3.8–10.8)

## 2021-07-07 NOTE — Progress Notes (Signed)
CBC and CMP normal

## 2021-07-26 ENCOUNTER — Ambulatory Visit (INDEPENDENT_AMBULATORY_CARE_PROVIDER_SITE_OTHER): Payer: BC Managed Care – PPO | Admitting: Family Medicine

## 2021-07-26 ENCOUNTER — Encounter: Payer: Self-pay | Admitting: Family Medicine

## 2021-07-26 VITALS — BP 100/72 | HR 49 | Temp 97.7°F | Ht 73.75 in | Wt 181.4 lb

## 2021-07-26 DIAGNOSIS — Z0000 Encounter for general adult medical examination without abnormal findings: Secondary | ICD-10-CM | POA: Diagnosis not present

## 2021-07-26 DIAGNOSIS — E7801 Familial hypercholesterolemia: Secondary | ICD-10-CM | POA: Diagnosis not present

## 2021-07-26 DIAGNOSIS — Z79899 Other long term (current) drug therapy: Secondary | ICD-10-CM

## 2021-07-26 DIAGNOSIS — Z970 Presence of artificial eye: Secondary | ICD-10-CM

## 2021-07-26 DIAGNOSIS — L4050 Arthropathic psoriasis, unspecified: Secondary | ICD-10-CM | POA: Diagnosis not present

## 2021-07-26 DIAGNOSIS — Z13220 Encounter for screening for lipoid disorders: Secondary | ICD-10-CM | POA: Diagnosis not present

## 2021-07-26 DIAGNOSIS — M47819 Spondylosis without myelopathy or radiculopathy, site unspecified: Secondary | ICD-10-CM | POA: Diagnosis not present

## 2021-07-26 DIAGNOSIS — E78019 Familial hypercholesterolemia, unspecified: Secondary | ICD-10-CM

## 2021-07-27 LAB — LIPID PANEL
Chol/HDL Ratio: 2.7 ratio (ref 0.0–5.0)
Cholesterol, Total: 184 mg/dL (ref 100–199)
HDL: 67 mg/dL (ref >39)
LDL Chol Calc (NIH): 92 mg/dL (ref 0–99)
Triglycerides: 145 mg/dL (ref 0–149)
VLDL Cholesterol Cal: 25 mg/dL (ref 5–40)

## 2021-08-01 ENCOUNTER — Other Ambulatory Visit: Payer: Self-pay | Admitting: Physician Assistant

## 2021-08-02 NOTE — Telephone Encounter (Signed)
Next Visit: 12/07/2021  Last Visit: 07/06/2021  Last Fill: 07/05/2021  DX:  Psoriatic arthritis   Current Dose per office note 07/06/2021:  MTX 8 tablets by mouth once weekly  Labs: 07/06/2021, CBC and CMP normal.  Okay to refill MTX?

## 2021-08-10 ENCOUNTER — Other Ambulatory Visit: Payer: Self-pay | Admitting: Rheumatology

## 2021-08-10 MED ORDER — COSENTYX SENSOREADY (300 MG) 150 MG/ML ~~LOC~~ SOAJ: SUBCUTANEOUS | 0 refills | Status: DC

## 2021-08-10 NOTE — Telephone Encounter (Signed)
Next Visit: 12/07/2021  Last Visit: 07/06/2021  Last Fill: 11/23/2021  DX: Psoriatic arthritis   Current Dose per office note 07/06/2021: Cosentyx 300 mg sq injections every 30 days  Labs: 07/06/2021 CBC and CMP normal.  TB Gold: 02/03/2021 Neg    Okay to refill Cosentyx?

## 2021-08-10 NOTE — Telephone Encounter (Signed)
Samantha from [facility] left a voicemail requesting a new prescription for patient's Cosentyx 150 mg pens.  Patient is approved through his insurance for a 90 day supply.   Phone [phone]

## 2021-08-13 ENCOUNTER — Encounter: Payer: Self-pay | Admitting: Family Medicine

## 2021-08-13 DIAGNOSIS — E7801 Familial hypercholesterolemia: Secondary | ICD-10-CM

## 2021-08-13 MED ORDER — ATORVASTATIN CALCIUM 10 MG PO TABS: 10.0000 mg | ORAL_TABLET | Freq: Every day | ORAL | 3 refills | Status: DC

## 2021-08-26 ENCOUNTER — Telehealth: Payer: Self-pay | Admitting: Pharmacist

## 2021-08-26 NOTE — Telephone Encounter (Addendum)
Submitted a Prior Authorization RENEWAL request to [facility] for [place] via fax. Will update once we receive a response.  Phone: [phone] Fax: [phone] Case ID # [id number]  Knox Saliva, PharmD, MPH, BCPS, CPP Clinical Pharmacist (Rheumatology and Pulmonology)

## 2021-08-30 NOTE — Telephone Encounter (Signed)
Received notification from [facility] regarding a prior authorization for [place]. Authorization has been APPROVED from 08/28/21 to 08/29/22.   Patient must continue to fill through [facility]: [phone]  Authorization # [id number]  Knox Saliva, PharmD, MPH, BCPS, CPP Clinical Pharmacist (Rheumatology and Pulmonology)

## 2021-09-03 ENCOUNTER — Encounter: Payer: Self-pay | Admitting: Rheumatology

## 2021-09-03 MED ORDER — METHOTREXATE 2.5 MG PO TABS: 20.0000 mg | ORAL_TABLET | ORAL | 0 refills | Status: DC

## 2021-09-03 MED ORDER — FOLIC ACID 1 MG PO TABS: 2.0000 mg | ORAL_TABLET | Freq: Every day | ORAL | 3 refills | Status: DC

## 2021-09-03 NOTE — Telephone Encounter (Signed)
Next Visit: 12/07/2021  Last Visit: 07/06/2021  DX: Psoriatic arthritis   Current Dose per office note 07/06/2021: MTX 8 tablets by mouth once weekly, folic acid 2 mg po daily  Labs: 07/06/2021 CBC and CMP normal.  Okay to refill MTX and Folic Acid?

## 2021-10-06 ENCOUNTER — Encounter: Payer: Self-pay | Admitting: Internal Medicine

## 2021-10-24 ENCOUNTER — Other Ambulatory Visit: Payer: Self-pay | Admitting: Rheumatology

## 2021-10-25 ENCOUNTER — Other Ambulatory Visit: Payer: Self-pay | Admitting: Rheumatology

## 2021-11-09 ENCOUNTER — Encounter: Payer: Self-pay | Admitting: Internal Medicine

## 2021-11-22 ENCOUNTER — Encounter: Payer: Self-pay | Admitting: Internal Medicine

## 2021-11-24 NOTE — Progress Notes (Unsigned)
Office Visit Note  Patient: Jacob Pena             Date of Birth: 08-29-1960           MRN: [id number]             PCP: Denita Lung, MD Referring: Denita Lung, MD Visit Date: 12/07/2021 Occupation: '@GUAROCC'$ @  Subjective:  Medication monitoring  History of Present Illness: Jacob Pena is a 61 y.o. male with history of psoriatic arthritis.  Patient remains on Cosentyx 300 mg sq injections every 30 days, MTX 8 tablets by mouth once weekly, folic acid 2 mg po daily.  He is tolerating combination therapy without any side effects.  He denies any signs or symptoms of a psoriatic arthritis flare.  He denies any active psoriasis at this time.  He denies any Achilles tendinitis or plantar fasciitis.  He denies any SI joint discomfort at this time.  He remains active exercising up to 4 hours daily.  He has also started to work on Hotel manager. He reports that last week he had an upper respiratory tract infection which resolved with over-the-counter products and rest.  He has not had any other recent or recurrent infections.  He has not had to miss a dose of Cosentyx. He has had the COVID-19 booster as well as the annual flu shot.  He denies any new medical conditions.    Activities of Daily Living:  Patient reports morning stiffness for a few minutes.   Patient Denies nocturnal pain.  Difficulty dressing/grooming: Denies Difficulty climbing stairs: Denies Difficulty getting out of chair: Denies Difficulty using hands for taps, buttons, cutlery, and/or writing: Denies  Review of Systems  Constitutional:  Negative for fatigue.  HENT:  Negative for mouth sores and mouth dryness.   Eyes:  Negative for dryness.  Respiratory:  Negative for shortness of breath.   Cardiovascular:  Negative for chest pain and palpitations.  Gastrointestinal:  Negative for blood in stool, constipation and diarrhea.  Endocrine: Negative for increased urination.  Genitourinary:  Negative for involuntary  urination.  Musculoskeletal:  Positive for morning stiffness. Negative for joint pain, gait problem, joint pain, joint swelling, myalgias, muscle weakness, muscle tenderness and myalgias.  Skin:  Negative for color change, rash and sensitivity to sunlight.  Allergic/Immunologic: Negative for susceptible to infections.  Neurological:  Negative for dizziness and headaches.  Hematological:  Negative for swollen glands.  Psychiatric/Behavioral:  Negative for depressed mood and sleep disturbance. The patient is not nervous/anxious.     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   Psoriatic arthritis ([place]) 01/08/2016   Spondyloarthropathy 01/08/2016   High risk medication use 01/08/2016   Familial hypercholesterolemia 05/18/2011    Past Medical History:  Diagnosis Date   Anxiety    Depression    per pt/ never had depression   Hyperlipidemia    Rheumatoid arthritis ([place])     Family History  Problem Relation Age of Onset   Colon cancer Paternal Grandmother    Diverticulitis Mother    Breast cancer Mother    Prostate cancer Father    Dementia Father    Esophageal cancer Neg Hx    Rectal cancer Neg Hx    Stomach cancer Neg Hx    Past Surgical History:  Procedure Laterality Date   APPENDECTOMY     COLONOSCOPY  06/22/2018   EYE SURGERY     left eye/prosthetic eye  ROTATOR CUFF REPAIR Left 01/04/2021   STOMACH SURGERY  1994   floating ligament   TONSILLECTOMY     Social History   Social History Narrative   Not on file   Immunization History  Administered Date(s) Administered   Influenza Split 10/30/2013, 11/30/2016   Influenza,inj,Quad PF,6+ Mos 01/15/2016, 11/30/2016, 11/15/2017, 11/26/2018   Influenza-Unspecified 11/15/2017, 12/11/2018, 09/18/2020   Moderna Covid-19 Vaccine Bivalent Booster 98yr & up 10/20/2020   Moderna Sars-Covid-2 Vaccination 04/04/2019, 05/02/2019, 11/21/2019, 04/21/2020   Pneumococcal  Conjugate-13 07/02/2020   Pneumococcal Polysaccharide-23 11/17/2011   Td 02/01/2007   Tdap 07/02/2020     Objective: Vital Signs: BP 121/76 (BP Location: Left Arm, Patient Position: Sitting, Cuff Size: Normal)   Pulse 66   Resp 16   Ht 6' 2.5" (1.892 m)   Wt 179 lb 6.4 oz (81.4 kg)   BMI 22.73 kg/m    Physical Exam Vitals and nursing note reviewed.  Constitutional:      Appearance: He is well-developed.  HENT:     Head: Normocephalic and atraumatic.  Eyes:     Conjunctiva/sclera: Conjunctivae normal.     Pupils: Pupils are equal, round, and reactive to light.  Cardiovascular:     Rate and Rhythm: Normal rate and regular rhythm.     Heart sounds: Normal heart sounds.  Pulmonary:     Effort: Pulmonary effort is normal.     Breath sounds: Normal breath sounds.  Abdominal:     General: Bowel sounds are normal.     Palpations: Abdomen is soft.  Musculoskeletal:     Cervical back: Normal range of motion and neck supple.  Skin:    General: Skin is warm and dry.     Capillary Refill: Capillary refill takes less than 2 seconds.  Neurological:     Mental Status: He is alert and oriented to person, place, and time.  Psychiatric:        Behavior: Behavior normal.      Musculoskeletal Exam: C-spine, thoracic spine, and lumbar spine good ROM.  No midline spinal tenderness.  No SI joint tenderness.  Shoulder joints, elbow joints, wrist joints, MCPs, PIPs, DIPs have good range of motion with no synovitis.  PIP and DIP thickening noted consistent with psoriatic arthritis and osteoarthritis overlap.  Hip joints have good ROM.  Knee joints have good ROM with no warmth or effusion.  Ankle joints have good ROM with no tenderness or joint swelling.   CDAI Exam: CDAI Score: -- Patient Global: --; Provider Global: -- Swollen: --; Tender: -- Joint Exam 12/07/2021   No joint exam has been documented for this visit   There is currently no information documented on the homunculus. Go to  the Rheumatology activity and complete the homunculus joint exam.  Investigation: No additional findings.  Imaging: No results found.  Recent Labs: Lab Results  Component Value Date   WBC 5.8 07/06/2021   HGB 14.0 07/06/2021   PLT 233 07/06/2021   NA 135 07/06/2021   K 4.5 07/06/2021   CL 101 07/06/2021   CO2 25 07/06/2021   GLUCOSE 85 07/06/2021   BUN 27 (H) 07/06/2021   CREATININE 1.21 07/06/2021   BILITOT 0.7 07/06/2021   ALKPHOS 58 07/02/2020   AST 33 07/06/2021   ALT 30 07/06/2021   PROT 6.5 07/06/2021   ALBUMIN 4.7 07/02/2020   CALCIUM 9.0 07/06/2021   GFRAA 72 02/07/2020   QFTBGOLDPLUS NEGATIVE 02/03/2021    Speciality Comments: No specialty comments available.  Procedures:  No procedures performed Allergies: Penicillins    Assessment / Plan:     Visit Diagnoses: Psoriatic arthritis ([place]): He has no synovitis or dactylitis on examination today.  He has not had any signs or symptoms of a psoriatic arthritis flare.  He has clinically been doing well on Cosentyx 300 mg subcutaneous injections every 4 weeks and methotrexate 8 tablets by mouth once weekly.  He has PIP and DIP thickening due to underlying psoriatic arthritis and osteoarthritis overlap.  No evidence of Achilles tendinitis or plantar fasciitis.  No SI joint tenderness upon palpation.  No history of uveitis.  No active psoriasis at this time.  He will remain on Cosentyx and methotrexate as combination therapy.  He was advised to notify us if he develops signs or symptoms of a flare.  He will follow-up in the office in 5 months or sooner if needed.  High risk medication use - Cosentyx 300 mg sq injections every 30 days, Methotrexate 8 tablets by mouth once weekly, folic acid 2 mg po daily.  CBC and CMP were drawn on 07/06/2021.  Orders for CBC and CMP were released today.  His next lab work will be due in February and every 3 months to monitor for drug toxicity.  TB Gold negative on 02/03/2021.  Future order for  TB Gold placed today. Discussed the importance of holding Cosentyx and methotrexate if he develops signs or symptoms of infection and to resume once the infection has completely cleared. - Plan: CBC with Differential/Platelet, COMPLETE METABOLIC PANEL WITH GFR, QuantiFERON-TB Gold Plus  S/P arthroscopy of left shoulder - Performed by Dr. Tamera Punt on 01/04/2021 for RCT.  Doing well.  Good range of motion with no discomfort.  He has been working on Editor, commissioning.  Familial hypercholesterolemia: He remains on Lipitor as prescribed.  Prosthetic eye globe  Orders: Orders Placed This Encounter  Procedures   CBC with Differential/Platelet   COMPLETE METABOLIC PANEL WITH GFR   QuantiFERON-TB Gold Plus   Meds ordered this encounter  Medications   methotrexate (RHEUMATREX) 2.5 MG tablet    Sig: Take 8 tablets (20 mg total) by mouth once a week. **protect from light**,    Dispense:  96 tablet    Refill:  0     Follow-Up Instructions: Return in about 5 months (around 05/08/2022) for Psoriatic arthritis.   Ofilia Neas, PA-C  Note - This record has been created using Dragon software.  Chart creation errors have been sought, but may not always  have been located. Such creation errors do not reflect on  the standard of medical care.

## 2021-11-26 ENCOUNTER — Other Ambulatory Visit: Payer: Self-pay | Admitting: Rheumatology

## 2021-11-30 ENCOUNTER — Other Ambulatory Visit: Payer: Self-pay | Admitting: Rheumatology

## 2021-11-30 NOTE — Telephone Encounter (Signed)
Next Visit: 12/07/2021  Last Visit: 07/06/2021  Last Fill: 08/10/2021  CH:TVGVSYVGC arthritis   Current Dose per office note 07/06/2021: Cosentyx 300 mg sq injections every 30 days  Labs:  07/06/2021 CBC and CMP normal.  TB Gold: 02/03/2021 Neg     Patient to update labs at upcoming appointment on 12/07/2021  Okay to refill Cosentyx?

## 2021-12-07 ENCOUNTER — Encounter: Payer: Self-pay | Admitting: Physician Assistant

## 2021-12-07 ENCOUNTER — Ambulatory Visit: Payer: BC Managed Care – PPO | Attending: Physician Assistant | Admitting: Physician Assistant

## 2021-12-07 VITALS — BP 121/76 | HR 66 | Resp 16 | Ht 74.5 in | Wt 179.4 lb

## 2021-12-07 DIAGNOSIS — E7801 Familial hypercholesterolemia: Secondary | ICD-10-CM | POA: Diagnosis not present

## 2021-12-07 DIAGNOSIS — L4050 Arthropathic psoriasis, unspecified: Secondary | ICD-10-CM | POA: Diagnosis not present

## 2021-12-07 DIAGNOSIS — Z79899 Other long term (current) drug therapy: Secondary | ICD-10-CM | POA: Diagnosis not present

## 2021-12-07 DIAGNOSIS — Z970 Presence of artificial eye: Secondary | ICD-10-CM

## 2021-12-07 DIAGNOSIS — Z9889 Other specified postprocedural states: Secondary | ICD-10-CM | POA: Diagnosis not present

## 2021-12-07 LAB — CBC WITH DIFFERENTIAL/PLATELET
Absolute Monocytes: 804 cells/uL (ref 200–950)
Basophils Absolute: 29 cells/uL (ref 0–200)
Basophils Relative: 0.3 %
Eosinophils Absolute: 39 cells/uL (ref 15–500)
Eosinophils Relative: 0.4 %
HCT: 41.8 % (ref 38.5–50.0)
Hemoglobin: 14.4 g/dL (ref 13.2–17.1)
Lymphs Abs: 1862 cells/uL (ref 850–3900)
MCH: 31.3 pg (ref 27.0–33.0)
MCHC: 34.4 g/dL (ref 32.0–36.0)
MCV: 90.9 fL (ref 80.0–100.0)
MPV: 9.4 fL (ref 7.5–12.5)
Monocytes Relative: 8.2 %
Neutro Abs: 7066 cells/uL (ref 1500–7800)
Neutrophils Relative %: 72.1 %
Platelets: 310 10*3/uL (ref 140–400)
RBC: 4.6 10*6/uL (ref 4.20–5.80)
RDW: 13.1 % (ref 11.0–15.0)
Total Lymphocyte: 19 %
WBC: 9.8 10*3/uL (ref 3.8–10.8)

## 2021-12-07 LAB — COMPLETE METABOLIC PANEL WITH GFR
AG Ratio: 2 (calc) (ref 1.0–2.5)
ALT: 39 U/L (ref 9–46)
AST: 32 U/L (ref 10–35)
Albumin: 4.8 g/dL (ref 3.6–5.1)
Alkaline phosphatase (APISO): 68 U/L (ref 35–144)
BUN/Creatinine Ratio: 25 (calc) — ABNORMAL HIGH (ref 6–22)
BUN: 30 mg/dL — ABNORMAL HIGH (ref 7–25)
CO2: 30 mmol/L (ref 20–32)
Calcium: 9.5 mg/dL (ref 8.6–10.3)
Chloride: 94 mmol/L — ABNORMAL LOW (ref 98–110)
Creat: 1.2 mg/dL (ref 0.70–1.30)
Globulin: 2.4 g/dL (calc) (ref 1.9–3.7)
Glucose, Bld: 72 mg/dL (ref 65–99)
Potassium: 5.5 mmol/L — ABNORMAL HIGH (ref 3.5–5.3)
Sodium: 131 mmol/L — ABNORMAL LOW (ref 135–146)
Total Bilirubin: 0.5 mg/dL (ref 0.2–1.2)
Total Protein: 7.2 g/dL (ref 6.1–8.1)
eGFR: 70 mL/min/{1.73_m2} (ref >=60)

## 2021-12-07 MED ORDER — METHOTREXATE SODIUM 2.5 MG PO TABS: ORAL_TABLET | ORAL | 0 refills | Status: DC

## 2021-12-07 NOTE — Progress Notes (Signed)
CBC WNL

## 2021-12-07 NOTE — Patient Instructions (Signed)
Standing Labs We placed an order today for your standing lab work.   Please have your standing labs drawn in February and every 3 months   Please have your labs drawn 2 weeks prior to your appointment so that the provider can discuss your lab results at your appointment.  Please note that you may see your imaging and lab results in MyChart before we have reviewed them. We will contact you once all results are reviewed. Please allow our office up to 72 hours to thoroughly review all of the results before contacting the office for clarification of your results.  Lab hours are:   Monday through Thursday from 8:00 am -12:30 pm and 1:00 pm-5:00 pm and Friday from 8:00 am-12:00 pm.  Please be advised, all patients with office appointments requiring lab work will take precedent over walk-in lab work.   Labs are drawn by [facility]. Please bring your co-pay at the time of your lab draw.  You may receive a bill from [facility] for your lab work.  Please note if you are on Hydroxychloroquine and and an order has been placed for a Hydroxychloroquine level, you will need to have it drawn 4 hours or more after your last dose.  If you wish to have your labs drawn at another location, please call the office 24 hours in advance so we can fax the orders.  The office is located at [address], [address], [place], [place] [address] No appointment is necessary.    If you have any questions regarding directions or hours of operation,  please call [phone].   As a reminder, please drink plenty of water prior to coming for your lab work. Thanks!  

## 2021-12-08 NOTE — Progress Notes (Signed)
Potassium is borderline elevated-5.5.  rest of CMP stable.  Please forward results to PCP. He will likely need to have BMP rechecked.

## 2022-01-04 DIAGNOSIS — D225 Melanocytic nevi of trunk: Secondary | ICD-10-CM | POA: Diagnosis not present

## 2022-01-04 DIAGNOSIS — L570 Actinic keratosis: Secondary | ICD-10-CM | POA: Diagnosis not present

## 2022-01-04 DIAGNOSIS — D2272 Melanocytic nevi of left lower limb, including hip: Secondary | ICD-10-CM | POA: Diagnosis not present

## 2022-01-04 DIAGNOSIS — D485 Neoplasm of uncertain behavior of skin: Secondary | ICD-10-CM | POA: Diagnosis not present

## 2022-01-04 DIAGNOSIS — Z1283 Encounter for screening for malignant neoplasm of skin: Secondary | ICD-10-CM | POA: Diagnosis not present

## 2022-01-04 DIAGNOSIS — X32XXXA Exposure to sunlight, initial encounter: Secondary | ICD-10-CM | POA: Diagnosis not present

## 2022-01-04 DIAGNOSIS — L905 Scar conditions and fibrosis of skin: Secondary | ICD-10-CM | POA: Diagnosis not present

## 2022-01-05 ENCOUNTER — Other Ambulatory Visit: Payer: Self-pay | Admitting: Physician Assistant

## 2022-01-05 NOTE — Telephone Encounter (Signed)
Next Visit: 05/23/2022  Last Visit: 12/07/2021  Last Fill: 11/30/2021  [id number] arthritis   Current Dose per office note on 12/07/2021: Cosentyx 300 mg sq injections every 30 days   Labs: 12/07/2021 Potassium is borderline elevated-5.5.  rest of CMP stable.   TB Gold: 02/03/2021 negative    Okay to refill cosentyx?

## 2022-01-18 ENCOUNTER — Encounter: Payer: Self-pay | Admitting: Internal Medicine

## 2022-02-04 DIAGNOSIS — D485 Neoplasm of uncertain behavior of skin: Secondary | ICD-10-CM | POA: Diagnosis not present

## 2022-02-04 DIAGNOSIS — L905 Scar conditions and fibrosis of skin: Secondary | ICD-10-CM | POA: Diagnosis not present

## 2022-02-08 ENCOUNTER — Encounter: Payer: Self-pay | Admitting: Internal Medicine

## 2022-02-17 ENCOUNTER — Other Ambulatory Visit: Payer: Self-pay | Admitting: Physician Assistant

## 2022-02-17 NOTE — Telephone Encounter (Signed)
Next Visit: 05/23/2022   Last Visit: 12/07/2021   Last Fill: 12/07/2021   FR:[id number] arthritis    Current Dose per office note on 12/07/2021: Methotrexate 8 tablets by mouth once weekly    Labs: 12/07/2021 Potassium is borderline elevated-5.5.  rest of CMP stable.   Okay to refill MTX?

## 2022-03-23 ENCOUNTER — Encounter: Payer: Self-pay | Admitting: *Deleted

## 2022-03-23 ENCOUNTER — Other Ambulatory Visit: Payer: Self-pay | Admitting: Physician Assistant

## 2022-03-23 NOTE — Telephone Encounter (Signed)
Next Visit: 05/23/2022   Last Visit: 12/07/2021   Last Fill: 01/05/2022   [id number] arthritis    Current Dose per office note on 12/07/2021:   Labs: 12/07/2021 Potassium is borderline elevated-5.5.  rest of CMP stable.   TB Gold: 02/03/2021 Neg   Message sent to patient via my chart to advise patient he is due to update labs.   Okay to refill Cosentyx?

## 2022-04-27 ENCOUNTER — Other Ambulatory Visit: Payer: Self-pay | Admitting: Rheumatology

## 2022-04-27 NOTE — Telephone Encounter (Signed)
Last Fill: 03/23/2022  Labs: 12/07/2021 CBC WNL. Potassium is borderline elevated-5.5.  rest of CMP stable.  Please forward results to PCP. He will likely need to have BMP rechecked.   TB Gold: 02/03/2021  Next Visit: 05/23/2022  Last Visit: 12/07/2021  [id number] arthritis   Current Dose per office note 12/07/2021: Cosentyx 300 mg sq injections every 30 days   Contacted patient to let him know he is due for labs. Patient states he plans to come in tomorrow, 04/28/2022, to get labs done.   Okay to refill Cosentyx?

## 2022-04-28 ENCOUNTER — Other Ambulatory Visit: Payer: Self-pay | Admitting: *Deleted

## 2022-04-28 DIAGNOSIS — Z79899 Other long term (current) drug therapy: Secondary | ICD-10-CM

## 2022-04-30 LAB — CBC WITH DIFFERENTIAL/PLATELET
Absolute Monocytes: 966 cells/uL — ABNORMAL HIGH (ref 200–950)
Basophils Absolute: 34 cells/uL (ref 0–200)
Basophils Relative: 0.4 %
Eosinophils Absolute: 50 cells/uL (ref 15–500)
Eosinophils Relative: 0.6 %
HCT: 41.5 % (ref 38.5–50.0)
Hemoglobin: 14.2 g/dL (ref 13.2–17.1)
Lymphs Abs: 2243 cells/uL (ref 850–3900)
MCH: 31 pg (ref 27.0–33.0)
MCHC: 34.2 g/dL (ref 32.0–36.0)
MCV: 90.6 fL (ref 80.0–100.0)
MPV: 9.4 fL (ref 7.5–12.5)
Monocytes Relative: 11.5 %
Neutro Abs: 5107 cells/uL (ref 1500–7800)
Neutrophils Relative %: 60.8 %
Platelets: 296 10*3/uL (ref 140–400)
RBC: 4.58 10*6/uL (ref 4.20–5.80)
RDW: 13.2 % (ref 11.0–15.0)
Total Lymphocyte: 26.7 %
WBC: 8.4 10*3/uL (ref 3.8–10.8)

## 2022-04-30 LAB — COMPLETE METABOLIC PANEL WITH GFR
AG Ratio: 1.7 (calc) (ref 1.0–2.5)
ALT: 35 U/L (ref 9–46)
AST: 23 U/L (ref 10–35)
Albumin: 4.5 g/dL (ref 3.6–5.1)
Alkaline phosphatase (APISO): 62 U/L (ref 35–144)
BUN/Creatinine Ratio: 34 (calc) — ABNORMAL HIGH (ref 6–22)
BUN: 43 mg/dL — ABNORMAL HIGH (ref 7–25)
CO2: 24 mmol/L (ref 20–32)
Calcium: 9.1 mg/dL (ref 8.6–10.3)
Chloride: 96 mmol/L — ABNORMAL LOW (ref 98–110)
Creat: 1.28 mg/dL (ref 0.70–1.35)
Globulin: 2.6 g/dL (calc) (ref 1.9–3.7)
Glucose, Bld: 61 mg/dL — ABNORMAL LOW (ref 65–99)
Potassium: 5.7 mmol/L — ABNORMAL HIGH (ref 3.5–5.3)
Sodium: 132 mmol/L — ABNORMAL LOW (ref 135–146)
Total Bilirubin: 0.6 mg/dL (ref 0.2–1.2)
Total Protein: 7.1 g/dL (ref 6.1–8.1)
eGFR: 64 mL/min/{1.73_m2} (ref >=60)

## 2022-04-30 LAB — QUANTIFERON-TB GOLD PLUS
Mitogen-NIL: 7.2 IU/mL
NIL: 0.07 IU/mL
QuantiFERON-TB Gold Plus: NEGATIVE
TB1-NIL: 0 IU/mL
TB2-NIL: 0 IU/mL

## 2022-05-01 NOTE — Progress Notes (Signed)
Sodium is low and potassium is high.  CBC is stable.  Patient should not take any potassium supplements.  Please advise patient to increase water intake.  Decrease the dose of methotrexate to 6 tablets p.o. weekly.  Repeat BMP with GFR in 3 weeks.  I will forward results to Dr. Redmond School for his review.

## 2022-05-02 ENCOUNTER — Other Ambulatory Visit: Payer: Self-pay | Admitting: *Deleted

## 2022-05-02 NOTE — Progress Notes (Signed)
TB gold negative

## 2022-05-03 MED ORDER — METHOTREXATE SODIUM 2.5 MG PO TABS: ORAL_TABLET | ORAL | 0 refills | Status: DC

## 2022-05-09 NOTE — Progress Notes (Signed)
Office Visit Note  Patient: Jacob Pena             Date of Birth: [birth year]           MRN: [id number]             PCP: Ronnald Nian, MD Referring: Ronnald Nian, MD Visit Date: 05/23/2022 Occupation: @GUAROCC @  Subjective:  Medication monitoring   History of Present Illness: Jacob Pena is a 62 y.o. male with history of psoriatic arthritis. He remains on Cosentyx 300 mg sq injections every 4 weeks, Methotrexate 6 tablets by mouth once weekly, and folic acid 2 mg po daily.  Patient was advised to reduce the dose of methotrexate from 8 tablets to 6 tablets once weekly after his last lab work on 04/28/2022 due to elevated creatinine.  Patient states that he has been ingesting more whey protein and ingesting protein bars over the past 1 year and has been working out twice daily.  He has family history of chronic kidney disease so he wants to ensure his renal function returns to baseline.  He denies any new or worsening symptoms on the reduced dose of methotrexate.  He denies any increased joint pain or joint swelling.  He denies any psoriasis.  He denies any Achilles tendinitis or plantar fasciitis.  He denies any SI joint discomfort.    Activities of Daily Living:  Patient reports morning stiffness for 0 minutes.   Patient Denies nocturnal pain.  Difficulty dressing/grooming: Denies Difficulty climbing stairs: Denies Difficulty getting out of chair: Denies Difficulty using hands for taps, buttons, cutlery, and/or writing: Denies  Review of Systems  Constitutional:  Negative for fatigue.  HENT:  Negative for mouth sores and mouth dryness.   Eyes:  Negative for dryness.  Respiratory:  Negative for shortness of breath.   Cardiovascular:  Negative for chest pain and palpitations.  Gastrointestinal:  Negative for blood in stool, constipation and diarrhea.  Endocrine: Negative for increased urination.  Genitourinary:  Negative for involuntary urination.  Musculoskeletal:   Negative for joint pain, gait problem, joint pain, joint swelling, myalgias, muscle weakness, morning stiffness, muscle tenderness and myalgias.  Skin:  Negative for color change, rash and sensitivity to sunlight.  Allergic/Immunologic: Negative for susceptible to infections.  Neurological:  Negative for dizziness and headaches.  Hematological:  Negative for swollen glands.  Psychiatric/Behavioral:  Positive for sleep disturbance. Negative for depressed mood. The patient is not nervous/anxious.     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   Psoriatic arthritis 01/08/2016   Spondyloarthropathy 01/08/2016   High risk medication use 01/08/2016   Familial hypercholesterolemia 05/18/2011    Past Medical History:  Diagnosis Date   Anxiety    Depression    per pt/ never had depression   Hyperlipidemia    Rheumatoid arthritis     Family History  Problem Relation Age of Onset   Colon cancer Paternal Grandmother    Diverticulitis Mother    Breast cancer Mother    Prostate cancer Father    Dementia Father    Esophageal cancer Neg Hx    Rectal cancer Neg Hx    Stomach cancer Neg Hx    Past Surgical History:  Procedure Laterality Date   APPENDECTOMY     COLONOSCOPY  06/22/2018   EYE SURGERY     left eye/prosthetic eye   ROTATOR CUFF REPAIR Left 01/04/2021   STOMACH SURGERY  1994  floating ligament   TONSILLECTOMY     Social History   Social History Narrative   Not on file   Immunization History  Administered Date(s) Administered   COVID-19, mRNA, vaccine(Comirnaty)12 years and older 11/25/2021   Influenza Split 10/30/2013, 11/30/2016, 10/21/2021   Influenza,inj,Quad PF,6+ Mos 01/15/2016, 11/30/2016, 11/15/2017, 11/26/2018   Influenza-Unspecified 11/15/2017, 12/11/2018, 09/18/2020   Moderna Covid-19 Vaccine Bivalent Booster 55yrs & up 10/20/2020   Moderna Sars-Covid-2 Vaccination 04/04/2019, 05/02/2019,  11/21/2019, 04/21/2020   Pneumococcal Conjugate-13 07/02/2020   Pneumococcal Polysaccharide-23 11/17/2011   Td 02/01/2007   Tdap 07/02/2020     Objective: Vital Signs: BP (!) 155/85 (BP Location: Left Arm, Patient Position: Sitting, Cuff Size: Normal)   Pulse (!) 47   Resp 15   Ht 6' 2.5" (1.892 m)   Wt 181 lb 3.2 oz (82.2 kg)   BMI 22.95 kg/m    Physical Exam Vitals and nursing note reviewed.  Constitutional:      Appearance: He is well-developed.  HENT:     Head: Normocephalic and atraumatic.  Eyes:     Conjunctiva/sclera: Conjunctivae normal.     Pupils: Pupils are equal, round, and reactive to light.  Cardiovascular:     Rate and Rhythm: Normal rate and regular rhythm.     Heart sounds: Normal heart sounds.  Pulmonary:     Effort: Pulmonary effort is normal.     Breath sounds: Normal breath sounds.  Abdominal:     General: Bowel sounds are normal.     Palpations: Abdomen is soft.  Musculoskeletal:     Cervical back: Normal range of motion and neck supple.  Skin:    General: Skin is warm and dry.     Capillary Refill: Capillary refill takes less than 2 seconds.  Neurological:     Mental Status: He is alert and oriented to person, place, and time.  Psychiatric:        Behavior: Behavior normal.      Musculoskeletal Exam: C-spine, thoracic spine, and lumbar spine good ROM.  No midline spinal tenderness.  No SI joint tenderness.  Shoulder joints, elbow joints, wrist joints, MCPs, PIPs, and DIPs good ROM with no synovitis.  PIP and DIP thickening. Hip joints, knee joints, and ankle joints have good ROM with no discomfort.  No warmth or effusion of knee joints.  No tenderness or swelling of ankle joints.    CDAI Exam: CDAI Score: -- Patient Global: --; Provider Global: -- Swollen: --; Tender: -- Joint Exam 05/23/2022   No joint exam has been documented for this visit   There is currently no information documented on the homunculus. Go to the Rheumatology activity  and complete the homunculus joint exam.  Investigation: No additional findings.  Imaging: No results found.  Recent Labs: Lab Results  Component Value Date   WBC 8.4 04/28/2022   HGB 14.2 04/28/2022   PLT 296 04/28/2022   NA 132 (L) 04/28/2022   K 5.7 (H) 04/28/2022   CL 96 (L) 04/28/2022   CO2 24 04/28/2022   GLUCOSE 61 (L) 04/28/2022   BUN 43 (H) 04/28/2022   CREATININE 1.28 04/28/2022   BILITOT 0.6 04/28/2022   ALKPHOS 58 07/02/2020   AST 23 04/28/2022   ALT 35 04/28/2022   PROT 7.1 04/28/2022   ALBUMIN 4.7 07/02/2020   CALCIUM 9.1 04/28/2022   GFRAA 72 02/07/2020   QFTBGOLDPLUS NEGATIVE 04/28/2022    Speciality Comments: No specialty comments available.  Procedures:  No procedures performed Allergies: Penicillins  Assessment / Plan:     Visit Diagnoses: Psoriatic arthritis: He has no synovitis or dactylitis on examination today. No evidence of achilles tendonitis or plantar fasciitis.  No SI joint tenderness.  No active psoriasis.  No signs or symptoms of uveitis.  He remains active exercising 1-2 times per day.  He has clinically been doing well on Cosentyx 300 mg sq injections once monthly along with methotrexate 6 tablets by mouth once weekly.  The dose of methotrexate was reduced from 8 tablets to 6 tablets after lab work on 04/28/2022 due to his creatinine trending up.  He has been avoiding the use of NSAIDs.  BMP with GFR will be updated today.  For now he will remain on the current dose of Cosentyx and methotrexate as prescribed.  He was advised to notify us if he develops signs or symptoms of a flare.   He will follow up in the office in 5 months or sooner if needed.   High risk medication use - Cosentyx 300 mg sq injections every 4 weeks, Methotrexate 6 tablets by mouth once weekly, folic acid 2 mg po daily. Reduced dose of MTX due to creatinine trending up.   CBC and CMP updated on 04/28/22. BMP with GFR updated today.   His next lab work will be due at the end  of June and every 3 months.   TB gold negative on 04/28/22.   Discussed the importance of holding cosentyx and MTX if she develops signs or symptoms of an infection and to resume once the infection has completely cleared.   - Plan: BASIC METABOLIC PANEL WITH GFR  S/P arthroscopy of left shoulder: Doing well.  Good ROM with no discomfort.   Other medical conditions are listed as follows:   Familial hypercholesterolemia  Prosthetic eye globe  Dyslipidemia  Orders: Orders Placed This Encounter  Procedures   BASIC METABOLIC PANEL WITH GFR   No orders of the defined types were placed in this encounter.    Follow-Up Instructions: Return in about 5 months (around 10/23/2022) for Psoriatic arthritis.   Gearldine Bienenstockaylor M Julien Oscar, PA-C  Note - This record has been created using Dragon software.  Chart creation errors have been sought, but may not always  have been located. Such creation errors do not reflect on  the standard of medical care.

## 2022-05-23 ENCOUNTER — Ambulatory Visit: Payer: BC Managed Care – PPO | Attending: Physician Assistant | Admitting: Physician Assistant

## 2022-05-23 ENCOUNTER — Encounter: Payer: Self-pay | Admitting: Physician Assistant

## 2022-05-23 VITALS — BP 155/85 | HR 47 | Resp 15 | Ht 74.5 in | Wt 181.2 lb

## 2022-05-23 DIAGNOSIS — Z79899 Other long term (current) drug therapy: Secondary | ICD-10-CM

## 2022-05-23 DIAGNOSIS — Z9889 Other specified postprocedural states: Secondary | ICD-10-CM

## 2022-05-23 DIAGNOSIS — L4050 Arthropathic psoriasis, unspecified: Secondary | ICD-10-CM

## 2022-05-23 DIAGNOSIS — E785 Hyperlipidemia, unspecified: Secondary | ICD-10-CM

## 2022-05-23 DIAGNOSIS — Z970 Presence of artificial eye: Secondary | ICD-10-CM

## 2022-05-23 DIAGNOSIS — E7801 Familial hypercholesterolemia: Secondary | ICD-10-CM

## 2022-05-23 NOTE — Patient Instructions (Signed)
Standing Labs We placed an order today for your standing lab work.   Please have your standing labs drawn in June and every 3 months   Please have your labs drawn 2 weeks prior to your appointment so that the provider can discuss your lab results at your appointment, if possible.  Please note that you may see your imaging and lab results in MyChart before we have reviewed them. We will contact you once all results are reviewed. Please allow our office up to 72 hours to thoroughly review all of the results before contacting the office for clarification of your results.  WALK-IN LAB HOURS  Monday through Thursday from 8:00 am -12:30 pm and 1:00 pm-5:00 pm and Friday from 8:00 am-12:00 pm.  Patients with office visits requiring labs will be seen before walk-in labs.  You may encounter longer than normal wait times. Please allow additional time. Wait times may be shorter on  Monday and Thursday afternoons.  We do not book appointments for walk-in labs. We appreciate your patience and understanding with our staff.   Labs are drawn by [facility]. Please bring your co-pay at the time of your lab draw.  You may receive a bill from [facility] for your lab work.  Please note if you are on Hydroxychloroquine and and an order has been placed for a Hydroxychloroquine level,  you will need to have it drawn 4 hours or more after your last dose.  If you wish to have your labs drawn at another location, please call the office 24 hours in advance so we can fax the orders.  The office is located at [address], [address], [place], [place] [address]   If you have any questions regarding directions or hours of operation,  please call [phone].   As a reminder, please drink plenty of water prior to coming for your lab work. Thanks!  

## 2022-05-24 LAB — BASIC METABOLIC PANEL WITH GFR
BUN: 22 mg/dL (ref 7–25)
CO2: 28 mmol/L (ref 20–32)
Calcium: 9 mg/dL (ref 8.6–10.3)
Chloride: 100 mmol/L (ref 98–110)
Creat: 0.98 mg/dL (ref 0.70–1.35)
Glucose, Bld: 94 mg/dL (ref 65–99)
Potassium: 4.9 mmol/L (ref 3.5–5.3)
Sodium: 134 mmol/L — ABNORMAL LOW (ref 135–146)
eGFR: 88 mL/min/{1.73_m2} (ref >=60)

## 2022-05-24 NOTE — Progress Notes (Signed)
Creatinine and GFR have improved. Sodium is borderline low. Rest of BMP WNL.   Continue on current treatment regimen.

## 2022-06-07 ENCOUNTER — Other Ambulatory Visit: Payer: Self-pay | Admitting: Physician Assistant

## 2022-06-07 NOTE — Telephone Encounter (Signed)
Last Fill: 04/27/2022  Labs: 05/23/2022 Sodium is low and potassium is high.  CBC is stable 05/23/2022 Creatinine and GFR have improved. Sodium is borderline low. Rest of BMP WNL.     TB Gold: 04/28/2022 Neg    Next Visit: 10/26/2022  Last Visit: 05/23/2022  DX: Psoriatic arthritis   Current Dose per office note 05/23/2022: Cosentyx 300 mg sq injections every 4 weeks   Okay to refill Cosentyx?

## 2022-06-29 DIAGNOSIS — D225 Melanocytic nevi of trunk: Secondary | ICD-10-CM | POA: Diagnosis not present

## 2022-06-29 DIAGNOSIS — Z1283 Encounter for screening for malignant neoplasm of skin: Secondary | ICD-10-CM | POA: Diagnosis not present

## 2022-06-29 DIAGNOSIS — X32XXXD Exposure to sunlight, subsequent encounter: Secondary | ICD-10-CM | POA: Diagnosis not present

## 2022-06-29 DIAGNOSIS — L570 Actinic keratosis: Secondary | ICD-10-CM | POA: Diagnosis not present

## 2022-06-29 DIAGNOSIS — L905 Scar conditions and fibrosis of skin: Secondary | ICD-10-CM | POA: Diagnosis not present

## 2022-07-26 ENCOUNTER — Other Ambulatory Visit: Payer: Self-pay | Admitting: Family Medicine

## 2022-07-26 DIAGNOSIS — E7801 Familial hypercholesterolemia: Secondary | ICD-10-CM

## 2022-08-02 ENCOUNTER — Telehealth: Payer: Self-pay | Admitting: Pharmacy Technician

## 2022-08-02 ENCOUNTER — Encounter: Payer: BC Managed Care – PPO | Admitting: Family Medicine

## 2022-08-02 NOTE — Telephone Encounter (Signed)
Pharmacy Patient Advocate Encounter  Received notification from [facility] that Prior Authorization for Cosentyx 300mg  has been  Faxed to Plan .  PA #/Case ID/Reference #: [id number]  Phone# [phone] Fax# [phone]

## 2022-08-03 NOTE — Telephone Encounter (Signed)
Patient Advocate Encounter  Prior Authorization for Cosentyx has been approved with [facility].    Effective dates: 08/03/22 through 08/03/23

## 2022-08-06 ENCOUNTER — Other Ambulatory Visit: Payer: Self-pay | Admitting: Rheumatology

## 2022-08-08 ENCOUNTER — Other Ambulatory Visit: Payer: Self-pay | Admitting: Rheumatology

## 2022-08-08 MED ORDER — METHOTREXATE SODIUM 2.5 MG PO TABS: ORAL_TABLET | ORAL | 0 refills | Status: DC

## 2022-08-08 NOTE — Telephone Encounter (Addendum)
Last Fill: 02/17/2022  Labs: 05/23/2022 BMP: Creatinine and GFR have improved. Sodium is borderline low. Rest of BMP WNL.   Continue on current treatment regimen. 04/28/2022 Sodium is low and potassium is high.  CBC is stable.  Patient should not take any potassium supplements.  Please advise patient to increase water intake.  Decrease the dose of methotrexate to 6 tablets p.o. weekly.  Repeat BMP with GFR in 3 weeks.   Next Visit: 10/26/2022  Last Visit: 05/23/2022  DX: Psoriatic arthritis   Current Dose per office note on 05/23/2022: Methotrexate 6 tablets by mouth once weekly  Sent patient a MyChart message to advise he is due to update labs.   Okay to refill Methotrexate?

## 2022-08-11 ENCOUNTER — Ambulatory Visit (INDEPENDENT_AMBULATORY_CARE_PROVIDER_SITE_OTHER): Payer: BC Managed Care – PPO | Admitting: Family Medicine

## 2022-08-11 ENCOUNTER — Encounter: Payer: Self-pay | Admitting: Family Medicine

## 2022-08-11 VITALS — BP 132/76 | HR 57 | Temp 97.3°F | Resp 16 | Ht 74.5 in | Wt 185.0 lb

## 2022-08-11 DIAGNOSIS — Z23 Encounter for immunization: Secondary | ICD-10-CM | POA: Diagnosis not present

## 2022-08-11 DIAGNOSIS — Z0000 Encounter for general adult medical examination without abnormal findings: Secondary | ICD-10-CM

## 2022-08-11 DIAGNOSIS — Z8042 Family history of malignant neoplasm of prostate: Secondary | ICD-10-CM

## 2022-08-11 DIAGNOSIS — M47819 Spondylosis without myelopathy or radiculopathy, site unspecified: Secondary | ICD-10-CM | POA: Diagnosis not present

## 2022-08-11 DIAGNOSIS — Z860100 Personal history of colon polyps, unspecified: Secondary | ICD-10-CM | POA: Insufficient documentation

## 2022-08-11 DIAGNOSIS — Z970 Presence of artificial eye: Secondary | ICD-10-CM

## 2022-08-11 DIAGNOSIS — Z125 Encounter for screening for malignant neoplasm of prostate: Secondary | ICD-10-CM | POA: Diagnosis not present

## 2022-08-11 DIAGNOSIS — L4050 Arthropathic psoriasis, unspecified: Secondary | ICD-10-CM

## 2022-08-11 DIAGNOSIS — E7801 Familial hypercholesterolemia: Secondary | ICD-10-CM | POA: Diagnosis not present

## 2022-08-11 DIAGNOSIS — Z79899 Other long term (current) drug therapy: Secondary | ICD-10-CM

## 2022-08-11 LAB — POCT URINALYSIS DIP (CLINITEK)
Bilirubin, UA: NEGATIVE
Blood, UA: NEGATIVE
Glucose, UA: NEGATIVE mg/dL
Ketones, POC UA: NEGATIVE mg/dL
Leukocytes, UA: NEGATIVE
Nitrite, UA: NEGATIVE
POC PROTEIN,UA: NEGATIVE
Spec Grav, UA: 1.01 (ref 1.010–1.025)
Urobilinogen, UA: 0.2 E.U./dL
pH, UA: 6 (ref 5.0–8.0)

## 2022-08-11 MED ORDER — ATORVASTATIN CALCIUM 10 MG PO TABS: 10.0000 mg | ORAL_TABLET | Freq: Every day | ORAL | 3 refills | Status: DC

## 2022-08-11 NOTE — Progress Notes (Signed)
Complete physical exam  Patient: Jacob Pena   DOB: November 27, 1960   62 y.o. Male  MRN: [id number]  Subjective:    Chief Complaint  Patient presents with   Annual Exam    Non-fasting. Has concerns about elevated electrolyte levels.     Jacob Pena is a 62 y.o. male who presents today for a complete physical exam. He reports consuming a general diet. Gym/ health club routine includes vigorous workouts 4-5 days a week. He generally feels fairly well. He reports sleeping fairly well. He continues to be followed for his psoriatic arthritis and presently is on 6 Cosentyx, methotrexate and folic acid.  He seems to doing quite nicely with that.  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  He does have a family history of prostate cancer and would like a PSA.  He apparently has also had a colonic polyp.  He is retired.  He is in a long-term relationship which seems to going well. He does state that he has noted an abnormal smell of ammonia when he works out aggressively.  Most recent fall risk assessment:    08/11/2022    1:52 PM  Fall Risk   Falls in the past year? 0  Number falls in past yr: 0  Injury with Fall? 0  Risk for fall due to : No Fall Risks  Follow up Falls evaluation completed     Most recent depression screenings:    08/11/2022    1:52 PM 07/26/2021    2:33 PM  PHQ 2/9 Scores  PHQ - 2 Score 0 0    Vision:Within last year and Dental: Receives regular dental care    Patient Care Team: Ronnald Nian, MD as PCP - General (Family Medicine)   Outpatient Medications Prior to Visit  Medication Sig   Acetaminophen (TYLENOL PO) Take by mouth as needed.   Cholecalciferol (VITAMIN D3) 125 MCG (5000 UT) CAPS Take 5,000 Units by mouth daily.   COSENTYX SENSOREADY, 300 MG, 150 MG/ML SOAJ INJECT 2 PENS UNDER THE SKIN EVERY 4 WEEKS   folic acid (FOLVITE) 1 MG tablet Take 2 tablets (2 mg total) by mouth daily.   methotrexate (RHEUMATREX) 2.5 MG tablet TAKE 6 TABLETS ONCE A  WEEK (PROTECT FROM LIGHT)   Multiple Vitamin (MULTIVITAMIN) tablet Take 1 tablet by mouth daily. [employer] Mens Multivitamin-Take one daily   Omega-3 Fatty Acids (FISH OIL PO) Take 1 capsule by mouth daily. Take 2 tablets daily   [DISCONTINUED] atorvastatin (LIPITOR) 10 MG tablet TAKE 1 TABLET DAILY   No facility-administered medications prior to visit.    Review of Systems  All other systems reviewed and are negative.         Objective:     BP 132/76   Pulse (!) 57   Temp (!) 97.3 F (36.3 C) (Oral)   Resp 16   Ht 6' 2.5" (1.892 m)   Wt 185 lb (83.9 kg)   SpO2 96% Comment: room air  BMI 23.43 kg/m    Physical Exam  Alert and in no distress. Tympanic membranes and canals are normal. Pharyngeal area is normal. Neck is supple without adenopathy or thyromegaly. Cardiac exam shows a regular sinus rhythm without murmurs or gallops. Lungs are clear to auscultation. Prosthetic eye present. Results for orders placed or performed in visit on 08/11/22  POCT URINALYSIS DIP (CLINITEK)  Result Value Ref Range   Color, UA yellow yellow   Clarity, UA clear clear   Glucose, UA negative  negative mg/dL   Bilirubin, UA negative negative   Ketones, POC UA negative negative mg/dL   Spec Grav, UA 6.045 4.098 - 1.025   Blood, UA negative negative   pH, UA 6.0 5.0 - 8.0   POC PROTEIN,UA negative negative, trace   Urobilinogen, UA 0.2 0.2 or 1.0 E.U./dL   Nitrite, UA Negative Negative   Leukocytes, UA Negative Negative       Assessment & Plan:   Encounter for annual physical exam - Plan: POCT URINALYSIS DIP (CLINITEK)  Familial hypercholesterolemia - Plan: atorvastatin (LIPITOR) 10 MG tablet, Lipid panel  Need for shingles vaccine - Plan: Varicella-zoster vaccine IM  Spondyloarthropathy  Psoriatic arthritis (HCC)  Prosthetic eye globe  High risk medication use  Family history of prostate cancer - Plan: PSA  Screening for prostate cancer - Plan: PSA  History of colonic  polyps   Immunization History  Administered Date(s) Administered   COVID-19, mRNA, vaccine(Comirnaty)12 years and older 11/25/2021   Influenza Split 10/30/2013, 11/30/2016, 10/21/2021   Influenza,inj,Quad PF,6+ Mos 01/15/2016, 11/30/2016, 11/15/2017, 11/26/2018   Influenza-Unspecified 11/15/2017, 12/11/2018, 09/18/2020   Moderna Covid-19 Vaccine Bivalent Booster 32yrs & up 10/20/2020   Moderna Sars-Covid-2 Vaccination 04/04/2019, 05/02/2019, 11/21/2019, 04/21/2020   Pneumococcal Conjugate-13 07/02/2020   Pneumococcal Polysaccharide-23 11/17/2011   Td 02/01/2007   Tdap 07/02/2020   Zoster Recombinant(Shingrix) 08/11/2022    Health Maintenance  Topic Date Due   COVID-19 Vaccine (7 - 2023-24 season) 01/20/2022   INFLUENZA VACCINE  09/01/2022   Zoster Vaccines- Shingrix (2 of 2) 10/06/2022   Colonoscopy  06/21/2025   DTaP/Tdap/Td (3 - Td or Tdap) 07/03/2030   Hepatitis C Screening  Completed   HIV Screening  Completed   HPV VACCINES  Aged Out    Discussed health benefits of physical activity, and discussed the fact that overworking the muscles in the body can end up causing more overuse type problems.  Encouraged him to make sure that he does not get overly aggressive with his workout routine.  Continue to be followed by rheumatology. Problem List Items Addressed This Visit     Familial hypercholesterolemia   Relevant Medications   atorvastatin (LIPITOR) 10 MG tablet   Other Relevant Orders   Lipid panel   High risk medication use   Prosthetic eye globe   Psoriatic arthritis (HCC)   Spondyloarthropathy   Other Visit Diagnoses     Encounter for annual physical exam    -  Primary   Relevant Orders   POCT URINALYSIS DIP (CLINITEK) (Completed)   Need for shingles vaccine       Relevant Orders   Varicella-zoster vaccine IM (Completed)   Family history of prostate cancer       Relevant Orders   PSA   Screening for prostate cancer       Relevant Orders   PSA       Follow-up 1 year    Sharlot Gowda, MD

## 2022-08-12 LAB — LIPID PANEL
Chol/HDL Ratio: 2.6 ratio (ref 0.0–5.0)
Cholesterol, Total: 190 mg/dL (ref 100–199)
HDL: 73 mg/dL (ref >39)
LDL Chol Calc (NIH): 94 mg/dL (ref 0–99)
Triglycerides: 135 mg/dL (ref 0–149)
VLDL Cholesterol Cal: 23 mg/dL (ref 5–40)

## 2022-08-12 LAB — PSA: Prostate Specific Ag, Serum: 0.7 ng/mL (ref 0.0–4.0)

## 2022-08-16 ENCOUNTER — Other Ambulatory Visit: Payer: Self-pay | Admitting: Rheumatology

## 2022-08-16 NOTE — Telephone Encounter (Signed)
Last Fill: 09/03/2021  Next Visit: 10/26/2022  Last Visit: 05/23/2022  Dx: Psoriatic arthritis   Current Dose per office note on 05/23/2022: folic acid 2 mg po daily.   Okay to refill Folic Acid?

## 2022-09-15 ENCOUNTER — Other Ambulatory Visit: Payer: Self-pay | Admitting: *Deleted

## 2022-09-15 DIAGNOSIS — Z79899 Other long term (current) drug therapy: Secondary | ICD-10-CM | POA: Diagnosis not present

## 2022-09-16 LAB — CBC WITH DIFFERENTIAL/PLATELET
Absolute Monocytes: 376 {cells}/uL (ref 200–950)
Basophils Absolute: 19 {cells}/uL (ref 0–200)
Basophils Relative: 0.5 %
Eosinophils Absolute: 68 {cells}/uL (ref 15–500)
Eosinophils Relative: 1.8 %
HCT: 42.7 % (ref 38.5–50.0)
Hemoglobin: 14.2 g/dL (ref 13.2–17.1)
Lymphs Abs: 1550 {cells}/uL (ref 850–3900)
MCH: 30.9 pg (ref 27.0–33.0)
MCHC: 33.3 g/dL (ref 32.0–36.0)
MCV: 93 fL (ref 80.0–100.0)
MPV: 10.1 fL (ref 7.5–12.5)
Monocytes Relative: 9.9 %
Neutro Abs: 1786 {cells}/uL (ref 1500–7800)
Neutrophils Relative %: 47 %
Platelets: 217 10*3/uL (ref 140–400)
RBC: 4.59 10*6/uL (ref 4.20–5.80)
RDW: 12.9 % (ref 11.0–15.0)
Total Lymphocyte: 40.8 %
WBC: 3.8 10*3/uL (ref 3.8–10.8)

## 2022-09-16 LAB — COMPLETE METABOLIC PANEL WITH GFR
AG Ratio: 2.1 (calc) (ref 1.0–2.5)
ALT: 23 U/L (ref 9–46)
AST: 30 U/L (ref 10–35)
Albumin: 4.7 g/dL (ref 3.6–5.1)
Alkaline phosphatase (APISO): 57 U/L (ref 35–144)
BUN/Creatinine Ratio: 26 (calc) — ABNORMAL HIGH (ref 6–22)
BUN: 27 mg/dL — ABNORMAL HIGH (ref 7–25)
CO2: 24 mmol/L (ref 20–32)
Calcium: 9 mg/dL (ref 8.6–10.3)
Chloride: 99 mmol/L (ref 98–110)
Creat: 1.02 mg/dL (ref 0.70–1.35)
Globulin: 2.2 g/dL (ref 1.9–3.7)
Glucose, Bld: 105 mg/dL — ABNORMAL HIGH (ref 65–99)
Potassium: 5.5 mmol/L — ABNORMAL HIGH (ref 3.5–5.3)
Sodium: 132 mmol/L — ABNORMAL LOW (ref 135–146)
Total Bilirubin: 0.3 mg/dL (ref 0.2–1.2)
Total Protein: 6.9 g/dL (ref 6.1–8.1)
eGFR: 84 mL/min/{1.73_m2} (ref >=60)

## 2022-09-16 NOTE — Progress Notes (Signed)
CBC is normal.  CMP is stable.  Sodium is low and potassium is elevated.  Please advise patient to avoid potassium supplements.  Please forward results to his PCP.

## 2022-09-21 ENCOUNTER — Other Ambulatory Visit: Payer: Self-pay | Admitting: Rheumatology

## 2022-09-21 NOTE — Telephone Encounter (Signed)
Last Fill: 06/07/2022  Labs: 09/15/2022 CBC is normal.  CMP is stable.  Sodium is low and potassium is elevated.   TB Gold: 04/28/2022 Neg    Next Visit: 10/26/2022  Last Visit: 05/23/2022  RU:[id number] arthritis   Current Dose per office note 05/23/2022: Cosentyx 300 mg sq injections every 4 weeks   Okay to refill Cosentyx?

## 2022-10-12 NOTE — Progress Notes (Deleted)
Office Visit Note  Patient: Jacob Pena             Date of Birth: Jun 28, 1960           MRN: [id number]             PCP: Ronnald Nian, MD Referring: Ronnald Nian, MD Visit Date: 10/26/2022 Occupation: @GUAROCC @  Subjective:  No chief complaint on file.   History of Present Illness: Jacob Pena is a 62 y.o. male ***     Activities of Daily Living:  Patient reports morning stiffness for *** {minute/hour:19697}.   Patient {ACTIONS;DENIES/REPORTS:21021675::"Denies"} nocturnal pain.  Difficulty dressing/grooming: {ACTIONS;DENIES/REPORTS:21021675::"Denies"} Difficulty climbing stairs: {ACTIONS;DENIES/REPORTS:21021675::"Denies"} Difficulty getting out of chair: {ACTIONS;DENIES/REPORTS:21021675::"Denies"} Difficulty using hands for taps, buttons, cutlery, and/or writing: {ACTIONS;DENIES/REPORTS:21021675::"Denies"}  No Rheumatology ROS completed.   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   Psoriatic arthritis (HCC) 01/08/2016   Spondyloarthropathy 01/08/2016   High risk medication use 01/08/2016   Familial hypercholesterolemia 05/18/2011    Past Medical History:  Diagnosis Date   Anxiety    Depression    per pt/ never had depression   Hyperlipidemia    Rheumatoid arthritis (HCC)     Family History  Problem Relation Age of Onset   Colon cancer Paternal Grandmother    Diverticulitis Mother    Breast cancer Mother    Prostate cancer Father    Dementia Father    Esophageal cancer Neg Hx    Rectal cancer Neg Hx    Stomach cancer Neg Hx    Past Surgical History:  Procedure Laterality Date   APPENDECTOMY     COLONOSCOPY  06/22/2018   EYE SURGERY     left eye/prosthetic eye   ROTATOR CUFF REPAIR Left 01/04/2021   STOMACH SURGERY  1994   floating ligament   TONSILLECTOMY     Social History   Social History  Narrative   Not on file   Immunization History  Administered Date(s) Administered   COVID-19, mRNA, vaccine(Comirnaty)12 years and older 11/25/2021   Influenza Split 10/30/2013, 11/30/2016, 10/21/2021   Influenza,inj,Quad PF,6+ Mos 01/15/2016, 11/30/2016, 11/15/2017, 11/26/2018   Influenza-Unspecified 11/15/2017, 12/11/2018, 09/18/2020   Moderna Covid-19 Vaccine Bivalent Booster 90yrs & up 10/20/2020   Moderna Sars-Covid-2 Vaccination 04/04/2019, 05/02/2019, 11/21/2019, 04/21/2020   Pneumococcal Conjugate-13 07/02/2020   Pneumococcal Polysaccharide-23 11/17/2011   Td 02/01/2007   Tdap 07/02/2020   Zoster Recombinant(Shingrix) 08/11/2022     Objective: Vital Signs: There were no vitals taken for this visit.   Physical Exam   Musculoskeletal Exam: ***  CDAI Exam: CDAI Score: -- Patient Global: --; Provider Global: -- Swollen: --; Tender: -- Joint Exam 10/26/2022   No joint exam has been documented for this visit   There is currently no information documented on the homunculus. Go to the Rheumatology activity and complete the homunculus joint exam.  Investigation: No additional findings.  Imaging: No results found.  Recent Labs: Lab Results  Component Value Date   WBC 3.8 09/15/2022   HGB 14.2 09/15/2022   PLT 217 09/15/2022   NA 132 (L) 09/15/2022   K 5.5 (H) 09/15/2022   CL 99 09/15/2022   CO2 24 09/15/2022   GLUCOSE 105 (H) 09/15/2022   BUN 27 (H) 09/15/2022   CREATININE 1.02 09/15/2022   BILITOT 0.3 09/15/2022   ALKPHOS 58 07/02/2020   AST 30  09/15/2022   ALT 23 09/15/2022   PROT 6.9 09/15/2022   ALBUMIN 4.7 07/02/2020   CALCIUM 9.0 09/15/2022   GFRAA 72 02/07/2020   QFTBGOLDPLUS NEGATIVE 04/28/2022    Speciality Comments: No specialty comments available.  Procedures:  No procedures performed Allergies: Penicillins   Assessment / Plan:     Visit Diagnoses: Psoriatic arthritis (HCC)  High risk medication use  S/P arthroscopy of left  shoulder  Familial hypercholesterolemia  Prosthetic eye globe  Dyslipidemia  Orders: No orders of the defined types were placed in this encounter.  No orders of the defined types were placed in this encounter.   Face-to-face time spent with patient was *** minutes. Greater than 50% of time was spent in counseling and coordination of care.  Follow-Up Instructions: No follow-ups on file.   Gearldine Bienenstock, PA-C  Note - This record has been created using Dragon software.  Chart creation errors have been sought, but may not always  have been located. Such creation errors do not reflect on  the standard of medical care.

## 2022-10-19 ENCOUNTER — Other Ambulatory Visit: Payer: Self-pay | Admitting: Rheumatology

## 2022-10-19 NOTE — Telephone Encounter (Signed)
Last Fill: 08/08/2022  Labs: 09/15/2022 CBC is normal.  CMP is stable.  Sodium is low and potassium is elevated.   Next Visit: 10/26/2022  Last Visit: 05/23/2022  DX: Psoriatic arthritis   Current Dose per office note 05/23/2022:  Methotrexate 6 tablets by mouth once weekly   Okay to refill Methotrexate?

## 2022-10-20 ENCOUNTER — Other Ambulatory Visit (INDEPENDENT_AMBULATORY_CARE_PROVIDER_SITE_OTHER): Payer: BC Managed Care – PPO

## 2022-10-20 DIAGNOSIS — Z23 Encounter for immunization: Secondary | ICD-10-CM

## 2022-10-24 ENCOUNTER — Ambulatory Visit: Payer: BC Managed Care – PPO | Admitting: Physician Assistant

## 2022-10-26 ENCOUNTER — Ambulatory Visit: Payer: BC Managed Care – PPO | Admitting: Physician Assistant

## 2022-10-26 DIAGNOSIS — Z79899 Other long term (current) drug therapy: Secondary | ICD-10-CM

## 2022-10-26 DIAGNOSIS — E7801 Familial hypercholesterolemia: Secondary | ICD-10-CM

## 2022-10-26 DIAGNOSIS — Z9889 Other specified postprocedural states: Secondary | ICD-10-CM

## 2022-10-26 DIAGNOSIS — Z970 Presence of artificial eye: Secondary | ICD-10-CM

## 2022-10-26 DIAGNOSIS — E785 Hyperlipidemia, unspecified: Secondary | ICD-10-CM

## 2022-10-26 DIAGNOSIS — L4050 Arthropathic psoriasis, unspecified: Secondary | ICD-10-CM

## 2022-11-02 ENCOUNTER — Ambulatory Visit: Payer: BC Managed Care – PPO | Attending: Physician Assistant | Admitting: Physician Assistant

## 2022-11-02 ENCOUNTER — Encounter: Payer: Self-pay | Admitting: Physician Assistant

## 2022-11-02 VITALS — BP 123/73 | HR 72 | Resp 15 | Ht 74.5 in | Wt 184.8 lb

## 2022-11-02 DIAGNOSIS — Z9889 Other specified postprocedural states: Secondary | ICD-10-CM

## 2022-11-02 DIAGNOSIS — E785 Hyperlipidemia, unspecified: Secondary | ICD-10-CM

## 2022-11-02 DIAGNOSIS — Z79899 Other long term (current) drug therapy: Secondary | ICD-10-CM

## 2022-11-02 DIAGNOSIS — L4050 Arthropathic psoriasis, unspecified: Secondary | ICD-10-CM

## 2022-11-02 DIAGNOSIS — E7801 Familial hypercholesterolemia: Secondary | ICD-10-CM | POA: Diagnosis not present

## 2022-11-02 DIAGNOSIS — Z970 Presence of artificial eye: Secondary | ICD-10-CM

## 2022-11-02 NOTE — Patient Instructions (Signed)
Standing Labs We placed an order today for your standing lab work.   Please have your standing labs drawn in Mid-November and every 3 months   Please have your labs drawn 2 weeks prior to your appointment so that the provider can discuss your lab results at your appointment, if possible.  Please note that you may see your imaging and lab results in MyChart before we have reviewed them. We will contact you once all results are reviewed. Please allow our office up to 72 hours to thoroughly review all of the results before contacting the office for clarification of your results.  WALK-IN LAB HOURS  Monday through Thursday from 8:00 am -12:30 pm and 1:00 pm-5:00 pm and Friday from 8:00 am-12:00 pm.  Patients with office visits requiring labs will be seen before walk-in labs.  You may encounter longer than normal wait times. Please allow additional time. Wait times may be shorter on  Monday and Thursday afternoons.  We do not book appointments for walk-in labs. We appreciate your patience and understanding with our staff.   Labs are drawn by [facility]. Please bring your co-pay at the time of your lab draw.  You may receive a bill from [facility] for your lab work.  Please note if you are on Hydroxychloroquine and and an order has been placed for a Hydroxychloroquine level,  you will need to have it drawn 4 hours or more after your last dose.  If you wish to have your labs drawn at another location, please call the office 24 hours in advance so we can fax the orders.  The office is located at [address], [place], [place] [address]   If you have any questions regarding directions or hours of operation,  please call [phone].   As a reminder, please drink plenty of water prior to coming for your lab work. Thanks!

## 2023-01-03 ENCOUNTER — Other Ambulatory Visit: Payer: Self-pay | Admitting: Rheumatology

## 2023-01-03 NOTE — Telephone Encounter (Signed)
Last Fill: 09/21/2022  Labs: 09/15/2022 CBC is normal.  CMP is stable.  Sodium is low and potassium is elevated.   TB Gold: 04/28/2022 Neg    Next Visit: 05/23/2023  Last Visit: 11/02/2022  [id number] arthritis   Current Dose per office note 11/02/2022: Cosentyx 300 mg sq injections every 4 weeks   Left message to advise patient she is due to update labs.   Okay to refill Cosentyx?

## 2023-01-10 ENCOUNTER — Other Ambulatory Visit: Payer: Self-pay | Admitting: Physician Assistant

## 2023-01-23 ENCOUNTER — Other Ambulatory Visit: Payer: Self-pay | Admitting: *Deleted

## 2023-01-23 DIAGNOSIS — Z79899 Other long term (current) drug therapy: Secondary | ICD-10-CM | POA: Diagnosis not present

## 2023-01-24 LAB — COMPLETE METABOLIC PANEL WITH GFR
AG Ratio: 1.7 (calc) (ref 1.0–2.5)
ALT: 42 U/L (ref 9–46)
AST: 33 U/L (ref 10–35)
Albumin: 4.2 g/dL (ref 3.6–5.1)
Alkaline phosphatase (APISO): 48 U/L (ref 35–144)
BUN: 18 mg/dL (ref 7–25)
CO2: 29 mmol/L (ref 20–32)
Calcium: 9.2 mg/dL (ref 8.6–10.3)
Chloride: 97 mmol/L — ABNORMAL LOW (ref 98–110)
Creat: 1.02 mg/dL (ref 0.70–1.35)
Globulin: 2.5 g/dL (ref 1.9–3.7)
Glucose, Bld: 93 mg/dL (ref 65–99)
Potassium: 4.6 mmol/L (ref 3.5–5.3)
Sodium: 134 mmol/L — ABNORMAL LOW (ref 135–146)
Total Bilirubin: 0.4 mg/dL (ref 0.2–1.2)
Total Protein: 6.7 g/dL (ref 6.1–8.1)
eGFR: 84 mL/min/{1.73_m2} (ref >=60)

## 2023-01-24 LAB — CBC WITH DIFFERENTIAL/PLATELET
Absolute Lymphocytes: 1255 {cells}/uL (ref 850–3900)
Absolute Monocytes: 546 {cells}/uL (ref 200–950)
Basophils Absolute: 10 {cells}/uL (ref 0–200)
Basophils Relative: 0.2 %
Eosinophils Absolute: 41 {cells}/uL (ref 15–500)
Eosinophils Relative: 0.8 %
HCT: 41.8 % (ref 38.5–50.0)
Hemoglobin: 13.8 g/dL (ref 13.2–17.1)
MCH: 30.9 pg (ref 27.0–33.0)
MCHC: 33 g/dL (ref 32.0–36.0)
MCV: 93.5 fL (ref 80.0–100.0)
MPV: 10.1 fL (ref 7.5–12.5)
Monocytes Relative: 10.7 %
Neutro Abs: 3249 {cells}/uL (ref 1500–7800)
Neutrophils Relative %: 63.7 %
Platelets: 208 10*3/uL (ref 140–400)
RBC: 4.47 10*6/uL (ref 4.20–5.80)
RDW: 13 % (ref 11.0–15.0)
Total Lymphocyte: 24.6 %
WBC: 5.1 10*3/uL (ref 3.8–10.8)

## 2023-01-25 NOTE — Progress Notes (Signed)
CBC and CMP are stable.  Sodium and chloride are low and stable.  Please forward results to his PCP.

## 2023-01-26 ENCOUNTER — Other Ambulatory Visit: Payer: Self-pay | Admitting: *Deleted

## 2023-01-26 MED ORDER — METHOTREXATE SODIUM 2.5 MG PO TABS: ORAL_TABLET | ORAL | 0 refills | Status: DC

## 2023-01-26 NOTE — Telephone Encounter (Signed)
Last Fill: 10/19/2022  Labs: 01/23/2023 CBC and CMP are stable.  Sodium and chloride are low and stable.  Please forward results to his PCP.   Next Visit: 05/23/2023  Last Visit: 11/02/2022  DX:  Psoriatic arthritis   Current Dose per office note 11/02/2022: Methotrexate 6 tablets by mouth once weekly,   Okay to refill Methotrexate?

## 2023-02-14 DIAGNOSIS — D225 Melanocytic nevi of trunk: Secondary | ICD-10-CM | POA: Diagnosis not present

## 2023-02-14 DIAGNOSIS — L570 Actinic keratosis: Secondary | ICD-10-CM | POA: Diagnosis not present

## 2023-02-14 DIAGNOSIS — X32XXXD Exposure to sunlight, subsequent encounter: Secondary | ICD-10-CM | POA: Diagnosis not present

## 2023-02-14 DIAGNOSIS — D2271 Melanocytic nevi of right lower limb, including hip: Secondary | ICD-10-CM | POA: Diagnosis not present

## 2023-02-14 DIAGNOSIS — D485 Neoplasm of uncertain behavior of skin: Secondary | ICD-10-CM | POA: Diagnosis not present

## 2023-02-14 DIAGNOSIS — Z1283 Encounter for screening for malignant neoplasm of skin: Secondary | ICD-10-CM | POA: Diagnosis not present

## 2023-02-20 ENCOUNTER — Other Ambulatory Visit: Payer: Self-pay | Admitting: Rheumatology

## 2023-02-20 DIAGNOSIS — Z111 Encounter for screening for respiratory tuberculosis: Secondary | ICD-10-CM

## 2023-02-20 DIAGNOSIS — Z9225 Personal history of immunosupression therapy: Secondary | ICD-10-CM

## 2023-02-21 NOTE — Telephone Encounter (Signed)
Last Fill: 01/04/2023 (30 day supply)  Labs: 01/23/2023 CBC and CMP are stable.  Sodium and chloride are low and stable.   TB Gold: 04/28/2022 Neg    Next Visit: 05/23/2023  Last Visit: 11/02/2022  DX: Psoriatic arthritis   Current Dose per office note 11/02/2022: Cosentyx 300 mg sq injections every 4 weeks   Okay to refill Cosentyx?

## 2023-03-28 ENCOUNTER — Encounter: Payer: Self-pay | Admitting: Internal Medicine

## 2023-04-07 ENCOUNTER — Other Ambulatory Visit: Payer: Self-pay | Admitting: Physician Assistant

## 2023-04-07 NOTE — Telephone Encounter (Signed)
 Last Fill: 01/26/2023  Labs: 01/23/2023 CBC and CMP are stable.  Sodium and chloride are low and stable.   Next Visit: 05/23/2023  Last Visit: 11/02/2022  DX: Psoriatic arthritis   Current Dose per office note 11/02/2022: Methotrexate 6 tablets by mouth once weekly,   Okay to refill Methotrexate?

## 2023-05-08 ENCOUNTER — Other Ambulatory Visit: Payer: Self-pay | Admitting: Physician Assistant

## 2023-05-08 DIAGNOSIS — Z79899 Other long term (current) drug therapy: Secondary | ICD-10-CM

## 2023-05-08 NOTE — Telephone Encounter (Signed)
 Last Fill: 02/21/2023  Labs: 01/23/2023 CBC and CMP are stable.  Sodium and chloride are low and stable.   TB Gold: 04/28/2022 Neg    Next Visit: 05/23/2023  Last Visit: 11/02/2022   DX: Psoriatic arthritis   Current Dose per office note 11/02/2022: Cosentyx 300 mg sq injections every 4 weeks   Patient to update labs at upcoming appointment on 05/23/2023  Okay to refill Cosentyx?

## 2023-05-09 NOTE — Progress Notes (Deleted)
 Office Visit Note  Patient: Jacob Pena             Date of Birth: Mar 08, 1960           MRN: [id number]             PCP: Ronnald Nian, MD Referring: Ronnald Nian, MD Visit Date: 05/23/2023 Occupation: @GUAROCC @  Subjective:  No chief complaint on file.   History of Present Illness: Jacob Pena is a 63 y.o. male ***     Activities of Daily Living:  Patient reports morning stiffness for *** {minute/hour:19697}.   Patient {ACTIONS;DENIES/REPORTS:21021675::"Denies"} nocturnal pain.  Difficulty dressing/grooming: {ACTIONS;DENIES/REPORTS:21021675::"Denies"} Difficulty climbing stairs: {ACTIONS;DENIES/REPORTS:21021675::"Denies"} Difficulty getting out of chair: {ACTIONS;DENIES/REPORTS:21021675::"Denies"} Difficulty using hands for taps, buttons, cutlery, and/or writing: {ACTIONS;DENIES/REPORTS:21021675::"Denies"}  No Rheumatology ROS completed.   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   Psoriatic arthritis (HCC) 01/08/2016   Spondyloarthropathy 01/08/2016   High risk medication use 01/08/2016   Familial hypercholesterolemia 05/18/2011    Past Medical History:  Diagnosis Date   Anxiety    Depression    per pt/ never had depression   Hyperlipidemia    Rheumatoid arthritis (HCC)     Family History  Problem Relation Age of Onset   Colon cancer Paternal Grandmother    Diverticulitis Mother    Breast cancer Mother    Prostate cancer Father    Dementia Father    Esophageal cancer Neg Hx    Rectal cancer Neg Hx    Stomach cancer Neg Hx    Past Surgical History:  Procedure Laterality Date   APPENDECTOMY     COLONOSCOPY  06/22/2018   EYE SURGERY     left eye/prosthetic eye   ROTATOR CUFF REPAIR Left 01/04/2021   STOMACH SURGERY  1994   floating ligament   TONSILLECTOMY     Social History   Social History  Narrative   Not on file   Immunization History  Administered Date(s) Administered   Influenza Split 10/30/2013, 11/30/2016, 10/21/2021   Influenza,inj,Quad PF,6+ Mos 01/15/2016, 11/30/2016, 11/15/2017, 11/26/2018   Influenza-Unspecified 11/15/2017, 12/11/2018, 09/18/2020, 10/18/2022   Moderna Covid-19 Vaccine Bivalent Booster 29yrs & up 10/20/2020   Moderna SARS-COV2 Booster Vaccination 10/18/2022   Moderna Sars-Covid-2 Vaccination 04/04/2019, 05/02/2019, 11/21/2019, 04/21/2020   Pfizer(Comirnaty)Fall Seasonal Vaccine 12 years and older 11/25/2021   Pneumococcal Conjugate-13 07/02/2020   Pneumococcal Polysaccharide-23 11/17/2011   Td 02/01/2007   Tdap 07/02/2020   Zoster Recombinant(Shingrix) 08/11/2022, 10/20/2022     Objective: Vital Signs: There were no vitals taken for this visit.   Physical Exam   Musculoskeletal Exam: ***  CDAI Exam: CDAI Score: -- Patient Global: --; Provider Global: -- Swollen: --; Tender: -- Joint Exam 05/23/2023   No joint exam has been documented for this visit   There is currently no information documented on the homunculus. Go to the Rheumatology activity and complete the homunculus joint exam.  Investigation: No additional findings.  Imaging: No results found.  Recent Labs: Lab Results  Component Value Date   WBC 5.1 01/23/2023   HGB 13.8 01/23/2023   PLT 208 01/23/2023   NA 134 (L) 01/23/2023   K 4.6 01/23/2023   CL 97 (L) 01/23/2023   CO2 29 01/23/2023   GLUCOSE 93 01/23/2023   BUN 18 01/23/2023   CREATININE 1.02 01/23/2023   BILITOT 0.4 01/23/2023  ALKPHOS 58 07/02/2020   AST 33 01/23/2023   ALT 42 01/23/2023   PROT 6.7 01/23/2023   ALBUMIN 4.7 07/02/2020   CALCIUM 9.2 01/23/2023   GFRAA 72 02/07/2020   QFTBGOLDPLUS NEGATIVE 04/28/2022    Speciality Comments: No specialty comments available.  Procedures:  No procedures performed Allergies: Penicillins   Assessment / Plan:     Visit Diagnoses: Psoriatic  arthritis (HCC)  High risk medication use  S/P arthroscopy of left shoulder  Dyslipidemia  Prosthetic eye globe  Orders: No orders of the defined types were placed in this encounter.  No orders of the defined types were placed in this encounter.   Face-to-face time spent with patient was *** minutes. Greater than 50% of time was spent in counseling and coordination of care.  Follow-Up Instructions: No follow-ups on file.   Pollyann Savoy, MD  Note - This record has been created using Animal nutritionist.  Chart creation errors have been sought, but may not always  have been located. Such creation errors do not reflect on  the standard of medical care.

## 2023-05-17 NOTE — Progress Notes (Unsigned)
 Office Visit Note  Patient: Jacob Pena             Date of Birth: December 24, 1960           MRN: [id number]             PCP: Watson Hacking, MD Referring: Watson Hacking, MD Visit Date: 05/25/2023 Occupation: @GUAROCC @  Subjective:  Pain in both elbows   History of Present Illness: Ireland Chagnon is a 63 y.o. male with history of psoriatic arthritis.  Patient remains on Cosentyx  300 mg sq injections every 4 weeks, Methotrexate  6 tablets by mouth once weekly, and folic acid  2 mg po daily. He is tolerating cosentyx  and methotrexate  without any side effects and has not had any recent gaps in therapy.  He denies any morning stiffness, nocturnal pain, or difficulty with ADLs.  Patient remains active exercising twice a day.  Patient states for the past 6 months he has been experiencing bilateral elbow pain on the medial aspect.  He denies any injury prior to the onset of symptoms.  He has still been able to perform his weightlifting exercises but has had persistent tenderness at the medial epicondyle.  He has tried applying voltaren  gel topically as needed for pain relief.  He denies any recent or recurrent infections.   Activities of Daily Living:  Patient denies any morning stiffness.   Patient Denies nocturnal pain.  Difficulty dressing/grooming: Denies Difficulty climbing stairs: Denies Difficulty getting out of chair: Denies Difficulty using hands for taps, buttons, cutlery, and/or writing: Denies  Review of Systems  Constitutional:  Negative for fatigue.  HENT:  Negative for mouth sores and mouth dryness.   Eyes:  Negative for dryness.  Respiratory:  Negative for shortness of breath.   Cardiovascular:  Negative for chest pain and palpitations.  Gastrointestinal:  Negative for blood in stool, constipation and diarrhea.  Endocrine: Negative for increased urination.  Genitourinary:  Negative for involuntary urination.  Musculoskeletal:  Positive for joint pain and joint pain.  Negative for gait problem, joint swelling, myalgias, muscle weakness, morning stiffness, muscle tenderness and myalgias.  Skin:  Negative for color change, rash, hair loss and sensitivity to sunlight.  Allergic/Immunologic: Negative for susceptible to infections.  Neurological:  Negative for dizziness and headaches.  Hematological:  Negative for swollen glands.  Psychiatric/Behavioral:  Negative for depressed mood and sleep disturbance. The patient is not nervous/anxious.     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   Psoriatic arthritis (HCC) 01/08/2016   Spondyloarthropathy 01/08/2016   High risk medication use 01/08/2016   Familial hypercholesterolemia 05/18/2011    Past Medical History:  Diagnosis Date   Anxiety    Depression    per pt/ never had depression   Hyperlipidemia    Rheumatoid arthritis (HCC)     Family History  Problem Relation Age of Onset   Colon cancer Paternal Grandmother    Diverticulitis Mother    Breast cancer Mother    Prostate cancer Father    Dementia Father    Esophageal cancer Neg Hx    Rectal cancer Neg Hx    Stomach cancer Neg Hx    Past Surgical History:  Procedure Laterality Date   APPENDECTOMY     COLONOSCOPY  06/22/2018   EYE SURGERY     left eye/prosthetic eye   ROTATOR CUFF  REPAIR Left 01/04/2021   STOMACH SURGERY  1994   floating ligament   TONSILLECTOMY     Social History   Social History Narrative   Not on file   Immunization History  Administered Date(s) Administered   Influenza Split 10/30/2013, 11/30/2016, 10/21/2021   Influenza,inj,Quad PF,6+ Mos 01/15/2016, 11/30/2016, 11/15/2017, 11/26/2018   Influenza-Unspecified 11/15/2017, 12/11/2018, 09/18/2020, 10/18/2022   Moderna Covid-19 Vaccine Bivalent Booster 7yrs & up 10/20/2020   Moderna SARS-COV2 Booster Vaccination  10/18/2022   Moderna Sars-Covid-2 Vaccination 04/04/2019, 05/02/2019, 11/21/2019, 04/21/2020   Pfizer(Comirnaty)Fall Seasonal Vaccine 12 years and older 11/25/2021   Pneumococcal Conjugate-13 07/02/2020   Pneumococcal Polysaccharide-23 11/17/2011   Td 02/01/2007   Tdap 07/02/2020   Zoster Recombinant(Shingrix ) 08/11/2022, 10/20/2022     Objective: Vital Signs: BP 121/75 (BP Location: Left Arm, Patient Position: Sitting, Cuff Size: Normal)   Pulse (!) 53   Resp 15   Ht 6' 2.5" (1.892 m)   Wt 189 lb (85.7 kg)   BMI 23.94 kg/m    Physical Exam Vitals and nursing note reviewed.  Constitutional:      Appearance: He is well-developed.  HENT:     Head: Normocephalic and atraumatic.  Eyes:     Conjunctiva/sclera: Conjunctivae normal.     Pupils: Pupils are equal, round, and reactive to light.  Cardiovascular:     Rate and Rhythm: Normal rate and regular rhythm.     Heart sounds: Normal heart sounds.  Pulmonary:     Effort: Pulmonary effort is normal.     Breath sounds: Normal breath sounds.  Abdominal:     General: Bowel sounds are normal.     Palpations: Abdomen is soft.  Musculoskeletal:     Cervical back: Normal range of motion and neck supple.  Skin:    General: Skin is warm and dry.     Capillary Refill: Capillary refill takes less than 2 seconds.  Neurological:     Mental Status: He is alert and oriented to person, place, and time.  Psychiatric:        Behavior: Behavior normal.      Musculoskeletal Exam: C-spine, thoracic spine, lumbar spine have good range of motion.  No midline spinal tenderness.  No SI joint tenderness.  Shoulder joints have good range of motion with no discomfort.  Tenderness over the medial epicondyle of both elbows.  No tenderness or inflammation over the elbow joint line.  Wrist joints, MCPs, PIPs, DIPs have good range of motion with no synovitis.  PIP and DIP thickening consistent with osteoarthritis of both hands.  Hip joints have good ROM  with no groin pain.  Knee joints have good ROM with no warmth or effusion.  Ankle joints have good ROM with no tenderness or joint swelling.    CDAI Exam: CDAI Score: -- Patient Global: --; Provider Global: -- Swollen: --; Tender: -- Joint Exam 05/25/2023   No joint exam has been documented for this visit   There is currently no information documented on the homunculus. Go to the Rheumatology activity and complete the homunculus joint exam.  Investigation: No additional findings.  Imaging: No results found.  Recent Labs: Lab Results  Component Value Date   WBC 5.1 01/23/2023   HGB 13.8 01/23/2023   PLT 208 01/23/2023   NA 134 (L) 01/23/2023   K 4.6 01/23/2023   CL 97 (L) 01/23/2023   CO2 29 01/23/2023   GLUCOSE 93 01/23/2023   BUN 18 01/23/2023   CREATININE 1.02 01/23/2023   BILITOT  0.4 01/23/2023   ALKPHOS 58 07/02/2020   AST 33 01/23/2023   ALT 42 01/23/2023   PROT 6.7 01/23/2023   ALBUMIN 4.7 07/02/2020   CALCIUM  9.2 01/23/2023   GFRAA 72 02/07/2020   QFTBGOLDPLUS NEGATIVE 04/28/2022    Speciality Comments: No specialty comments available.  Procedures:  No procedures performed Allergies: Penicillins    Assessment / Plan:     Visit Diagnoses: Psoriatic arthritis (HCC): No synovitis or dactylitis noted on examination today.  No SI joint tenderness upon palpation.  No evidence of Achilles tendinitis or plantar fasciitis.  He has not been experiencing any morning stiffness, nocturnal pain, or difficulty with ADLs.  He has no active psoriasis at this time.  He has clinically been doing well on Cosentyx  300 mg subcu injections every 4 weeks, methotrexate  6 tablets by mouth once weekly, and folic acid  2 mg daily.  He is tolerating combination therapy without any side effects and has not had any recent gaps in therapy.  Patient remains active exercising twice a day.  He has been experiencing some discomfort in the medial aspect of both elbows consistent with medial  epicondylitis.  Different treatment options were discussed today including physical therapy, home exercises, bracing, ice, and the use of Voltaren  gel.  Patient plans on trying home exercises and Voltaren  gel will notify us  if his symptoms persist or worsen. He will remain on Cosentyx  and methotrexate  as combination therapy.  He was advised to notify us  if he develops any new or worsening symptoms.  He will follow-up in the office in 5 months or sooner if needed.  High risk medication use - Cosentyx  300 mg sq injections every 4 weeks, Methotrexate  6 tablets by mouth once weekly, folic acid  2 mg po daily.  Reduced dose of MTX due to creatinine CBC and CMP updated on 01/23/23. Orders for CBC and CMP released today. His next lab work will be due in July and every 3 months.  TB gold negative on 04/28/22. Order for TB gold released today.  No recent or recurrent infections.  Discussed the importance of holding cosentyx  and methotrexate  if he develops signs or symptoms of an infection and to resume once the infection has completely cleared.   - Plan: CBC with Differential/Platelet, Comprehensive metabolic panel with GFR, QuantiFERON-TB Gold Plus  Screening for tuberculosis - Order for TB gold released today. Plan: QuantiFERON-TB Gold Plus  Medial epicondylitis of both elbows: Tenderness over the medial epicondyle of both elbows.  He has been experiencing symptoms consistent with medial epicondylitis of both elbows for the past 6 months.  He remains active and exercising twice a day and has been able to continue upper extremity strengthening but has soreness over the medial epicondyle bilaterally.  He has tried applying Voltaren  gel topically as needed for pain relief.  Different treatment options were discussed today in detail.  Discussed that the first line treatment is physical therapy.  He would like to try home exercises.  Patient was given a handout of exercises to perform.  He can also try a brace, ice,  and using Voltaren  gel topically for pain relief.  He will notify us  if his symptoms persist or worsen.  S/P arthroscopy of left shoulder: Doing well. Good ROM with no discomfort.    Other medical conditions are listed as follows:   Familial hypercholesterolemia: Lipid panel WNL on 08/11/22.   Prosthetic eye globe  Dyslipidemia: Lipid panel WNL on 08/11/22.    Orders: Orders Placed This Encounter  Procedures   CBC with Differential/Platelet   Comprehensive metabolic panel with GFR   QuantiFERON-TB Gold Plus   No orders of the defined types were placed in this encounter.     Follow-Up Instructions: Return in about 6 months (around 11/24/2023) for Psoriatic arthritis.   Romayne Clubs, PA-C  Note - This record has been created using Dragon software.  Chart creation errors have been sought, but may not always  have been located. Such creation errors do not reflect on  the standard of medical care.

## 2023-05-23 ENCOUNTER — Ambulatory Visit: Payer: BC Managed Care – PPO | Admitting: Rheumatology

## 2023-05-23 DIAGNOSIS — Z79899 Other long term (current) drug therapy: Secondary | ICD-10-CM

## 2023-05-23 DIAGNOSIS — E7801 Familial hypercholesterolemia: Secondary | ICD-10-CM

## 2023-05-23 DIAGNOSIS — Z9889 Other specified postprocedural states: Secondary | ICD-10-CM

## 2023-05-23 DIAGNOSIS — Z970 Presence of artificial eye: Secondary | ICD-10-CM

## 2023-05-23 DIAGNOSIS — L4050 Arthropathic psoriasis, unspecified: Secondary | ICD-10-CM

## 2023-05-23 DIAGNOSIS — E785 Hyperlipidemia, unspecified: Secondary | ICD-10-CM

## 2023-05-25 ENCOUNTER — Ambulatory Visit: Attending: Physician Assistant | Admitting: Physician Assistant

## 2023-05-25 ENCOUNTER — Encounter: Payer: Self-pay | Admitting: Physician Assistant

## 2023-05-25 VITALS — BP 121/75 | HR 53 | Resp 15 | Ht 74.5 in | Wt 189.0 lb

## 2023-05-25 DIAGNOSIS — E785 Hyperlipidemia, unspecified: Secondary | ICD-10-CM

## 2023-05-25 DIAGNOSIS — Z111 Encounter for screening for respiratory tuberculosis: Secondary | ICD-10-CM

## 2023-05-25 DIAGNOSIS — Z9889 Other specified postprocedural states: Secondary | ICD-10-CM | POA: Diagnosis not present

## 2023-05-25 DIAGNOSIS — Z970 Presence of artificial eye: Secondary | ICD-10-CM

## 2023-05-25 DIAGNOSIS — M7702 Medial epicondylitis, left elbow: Secondary | ICD-10-CM

## 2023-05-25 DIAGNOSIS — E7801 Familial hypercholesterolemia: Secondary | ICD-10-CM | POA: Diagnosis not present

## 2023-05-25 DIAGNOSIS — L4050 Arthropathic psoriasis, unspecified: Secondary | ICD-10-CM

## 2023-05-25 DIAGNOSIS — Z79899 Other long term (current) drug therapy: Secondary | ICD-10-CM

## 2023-05-25 DIAGNOSIS — M7701 Medial epicondylitis, right elbow: Secondary | ICD-10-CM

## 2023-05-25 NOTE — Patient Instructions (Addendum)
 Standing Labs We placed an order today for your standing lab work.   Please have your standing labs drawn in July and every 3 months   Please have your labs drawn 2 weeks prior to your appointment so that the provider can discuss your lab results at your appointment, if possible.  Please note that you may see your imaging and lab results in MyChart before we have reviewed them. We will contact you once all results are reviewed. Please allow our office up to 72 hours to thoroughly review all of the results before contacting the office for clarification of your results.  WALK-IN LAB HOURS  Monday through Thursday from 8:00 am -12:30 pm and 1:00 pm-5:00 pm and Friday from 8:00 am-12:00 pm.  Patients with office visits requiring labs will be seen before walk-in labs.  You may encounter longer than normal wait times. Please allow additional time. Wait times may be shorter on  Monday and Thursday afternoons.  We do not book appointments for walk-in labs. We appreciate your patience and understanding with our staff.   Labs are drawn by [facility]. Please bring your co-pay at the time of your lab draw.  You may receive a bill from [facility] for your lab work.  Please note if you are on Hydroxychloroquine and and an order has been placed for a Hydroxychloroquine level,  you will need to have it drawn 4 hours or more after your last dose.  If you wish to have your labs drawn at another location, please call the office 24 hours in advance so we can fax the orders.  The office is located at [address], [address], [place], [place] [address]   If you have any questions regarding directions or hours of operation,  please call [phone].   As a reminder, please drink plenty of water prior to coming for your lab work. Thanks!   Exercises for Golfer's Elbow Elbow exercises can help you get better if you have golfer's elbow. Only do the exercises you were told to do. Make sure you know how to do the  exercises safely. Follow the steps below. It's normal to feel mild discomfort. Stop if you feel pain or your pain gets worse. Do not start these exercises until told by your health care provider. Stretching and range-of-motion exercises These exercises warm up your muscles and joints. They can help your elbow move better and be more flexible. Wrist extension, assisted  Straighten your left / right elbow in front of you with your palm facing up toward the ceiling. If told by your provider, bend your left / right elbow to a 90-degree angle (right angle) at your side. Do this instead of holding it straight. With your other hand, gently pull your left / right hand and fingers toward the floor. Stop when you feel a gentle stretch on the palm side of your forearm. Hold this position for __________ seconds. Repeat __________ times. Do this exercise __________ times a day. Wrist flexion, assisted  Straighten your left / right elbow in front of you with your palm facing down toward the floor. If told by your provider, bend your left / right elbow to a 90-degree angle at your side. Do this instead of holding it straight. With your other hand, gently push over the back of your left / right hand so your fingers point toward the floor. Stop when you feel a gentle stretch on the back of your forearm. Hold this position for __________ seconds. Repeat  __________ times. Do this exercise __________ times a day. Assisted forearm rotation, supination  Sit or stand with your elbows at your side. Bend your left / right elbow to a 90-degree angle. Using your uninjured hand, turn your left / right palm up toward the ceiling. Stop when you feel a gentle stretch along the inside of your forearm. Hold this position for __________ seconds. Repeat __________ times. Do this exercise __________ times a day. Assisted forearm rotation, pronation  Sit or stand with your elbows at your side. Bend your left / right elbow to  a 90-degree angle. Using your uninjured hand, turn your left / right palm down toward the floor. Stop when you feel a gentle stretch along the top of your forearm. Hold this position for __________ seconds. Repeat __________ times. Do this exercise __________ times a day. Strengthening exercises These exercises build strength and endurance in your elbow. Endurance is the ability to use your muscles for a long time, even after they get tired. Wrist flexion  Sit with your left / right forearm supported on a table or other surface. Turn your palm up toward the ceiling. Let your left / right wrist extend over the edge of the surface. Hold a __________ weight or a piece of rubber exercise band or tubing. If using a rubber exercise band or tubing, hold the other end of the tubing with your other hand. Slowly bend your wrist so your hand moves up toward the ceiling. Try to only move your wrist. Keep the rest of your arm still. Hold this position for __________ seconds. Slowly go back to the starting position. Repeat __________ times. Do this exercise __________ times a day. Wrist flexion, eccentric  Sit with your left / right forearm supported on a table or other surface. Turn your palm up toward the ceiling. Let your left / right wrist extend over the edge of the surface. Hold a __________ weight or a piece of rubber exercise band or tubing in your left / right hand. If using a rubber exercise band or tubing, hold the other end of the tubing with your other hand. Use your uninjured hand to move your left / right hand up toward the ceiling. Take your other hand away. Slowly go back to the starting position using only your left / right hand. Repeat __________ times. Do this exercise __________ times a day. Forearm rotation, pronation To do this exercise, you'll need a lightweight hammer or rubber mallet. Sit with your left / right forearm supported on a table or other surface. Bend your elbow to a  90-degree angle. Have your forearm so that your palm is facing up toward the ceiling, with your hand resting over the edge of the table. Hold a hammer in your left / right hand. To make this exercise easier, hold the hammer near the head of the hammer. To make this exercise harder, hold the hammer near the end of the handle. Without moving your elbow, slowly turn your forearm so your palm faces down toward the floor. Hold this position for __________ seconds. Slowly return to the starting position. Repeat __________ times. Do this exercise __________ times a day. Shoulder blade squeeze     Sit in a stable chair or stand with good posture. If you're sitting down, don't let your back touch the back of the chair. Your arms should be at your sides with your elbows bent to a 90-degree angle. Position your forearms so that your thumbs are facing the  ceiling. Without lifting your shoulders up, squeeze your shoulder blades tightly together. Hold this position for __________ seconds. Slowly release. Go back to the starting position. Repeat __________ times. Do this exercise __________ times a day. This information is not intended to replace advice given to you by your health care provider. Make sure you discuss any questions you have with your health care provider. Document Revised: 08/11/2022 Document Reviewed: 08/11/2022 Elsevier Patient Education  2024 [employer]. he is tolerating Cosentyx 

## 2023-05-26 ENCOUNTER — Telehealth: Payer: Self-pay | Admitting: *Deleted

## 2023-05-26 ENCOUNTER — Encounter: Payer: Self-pay | Admitting: *Deleted

## 2023-05-26 DIAGNOSIS — Z79899 Other long term (current) drug therapy: Secondary | ICD-10-CM

## 2023-05-26 NOTE — Progress Notes (Signed)
 CBC WNL AST and ALT are elevated-please clarify if he has been taking any tylenol or alcohol use? Rest of CMP WNL. Recommend reducing methotrexate  to 5 tablets once weekly and rechecking LFTs in 2-3 weeks.

## 2023-05-26 NOTE — Telephone Encounter (Signed)
-----   Message from Romayne Clubs sent at 05/26/2023  7:18 AM EDT ----- CBC WNL AST and ALT are elevated-please clarify if he has been taking any tylenol or alcohol use? Rest of CMP WNL. Recommend reducing methotrexate  to 5 tablets once weekly and rechecking LFTs in 2-3 weeks.

## 2023-05-27 LAB — CBC WITH DIFFERENTIAL/PLATELET
Absolute Lymphocytes: 1879 {cells}/uL (ref 850–3900)
Absolute Monocytes: 465 {cells}/uL (ref 200–950)
Basophils Absolute: 31 {cells}/uL (ref 0–200)
Basophils Relative: 0.5 %
Eosinophils Absolute: 118 {cells}/uL (ref 15–500)
Eosinophils Relative: 1.9 %
HCT: 43.7 % (ref 38.5–50.0)
Hemoglobin: 14.4 g/dL (ref 13.2–17.1)
MCH: 31.4 pg (ref 27.0–33.0)
MCHC: 33 g/dL (ref 32.0–36.0)
MCV: 95.2 fL (ref 80.0–100.0)
MPV: 9.7 fL (ref 7.5–12.5)
Monocytes Relative: 7.5 %
Neutro Abs: 3708 {cells}/uL (ref 1500–7800)
Neutrophils Relative %: 59.8 %
Platelets: 236 10*3/uL (ref 140–400)
RBC: 4.59 10*6/uL (ref 4.20–5.80)
RDW: 13.3 % (ref 11.0–15.0)
Total Lymphocyte: 30.3 %
WBC: 6.2 10*3/uL (ref 3.8–10.8)

## 2023-05-27 LAB — COMPREHENSIVE METABOLIC PANEL WITH GFR
AG Ratio: 2 (calc) (ref 1.0–2.5)
ALT: 51 U/L — ABNORMAL HIGH (ref 9–46)
AST: 39 U/L — ABNORMAL HIGH (ref 10–35)
Albumin: 4.6 g/dL (ref 3.6–5.1)
Alkaline phosphatase (APISO): 53 U/L (ref 35–144)
BUN: 17 mg/dL (ref 7–25)
CO2: 26 mmol/L (ref 20–32)
Calcium: 9.2 mg/dL (ref 8.6–10.3)
Chloride: 99 mmol/L (ref 98–110)
Creat: 1.05 mg/dL (ref 0.70–1.35)
Globulin: 2.3 g/dL (ref 1.9–3.7)
Glucose, Bld: 90 mg/dL (ref 65–99)
Potassium: 4.8 mmol/L (ref 3.5–5.3)
Sodium: 136 mmol/L (ref 135–146)
Total Bilirubin: 0.3 mg/dL (ref 0.2–1.2)
Total Protein: 6.9 g/dL (ref 6.1–8.1)
eGFR: 81 mL/min/{1.73_m2} (ref >=60)

## 2023-05-27 LAB — QUANTIFERON-TB GOLD PLUS
Mitogen-NIL: 6.1 [IU]/mL
NIL: 0.01 [IU]/mL
QuantiFERON-TB Gold Plus: NEGATIVE
TB1-NIL: 0.01 [IU]/mL
TB2-NIL: 0 [IU]/mL

## 2023-05-29 NOTE — Progress Notes (Signed)
 TB gold negative

## 2023-06-05 ENCOUNTER — Other Ambulatory Visit: Payer: Self-pay | Admitting: Physician Assistant

## 2023-06-06 NOTE — Telephone Encounter (Signed)
 Last Fill: 05/08/2023  Labs: 05/25/2023 CBC WNL, AST and ALT are elevate, Rest of CMP WNL.   TB Gold: 05/25/2023 Neg   Next Visit: 12/01/2023  Last Visit: 05/25/2023  [id number] arthritis   Current Dose per office note 05/25/2023: Cosentyx  300 mg sq injections every 4 weeks   Okay to refill Cosentyx ?

## 2023-06-28 ENCOUNTER — Other Ambulatory Visit: Payer: Self-pay | Admitting: Physician Assistant

## 2023-06-28 NOTE — Telephone Encounter (Signed)
 Last Fill: 04/07/2023  Labs: 05/25/2023 CBC WNL AST and ALT are elevated Rest of CMP WNL.   Next Visit: 12/01/2023  Last Visit: 05/25/2023  DX: Psoriatic arthritis   Current Dose per lab note 05/25/2023: Recommend reducing methotrexate  to 5 tablets once weekly   Okay to refill Methotrexate ?

## 2023-07-04 ENCOUNTER — Telehealth: Payer: Self-pay | Admitting: Pharmacist

## 2023-07-04 NOTE — Telephone Encounter (Signed)
 Submitted a Prior Authorization RENEWAL request to [facility] for COSENTYX  SQ via fax. Will update once we receive a response.  Fax: [phone] Phone: [phone] Case # [id number]  Geraldene Kleine, PharmD, MPH, BCPS, CPP Clinical Pharmacist (Rheumatology and Pulmonology)

## 2023-08-04 ENCOUNTER — Other Ambulatory Visit: Payer: Self-pay | Admitting: Rheumatology

## 2023-08-15 ENCOUNTER — Ambulatory Visit (INDEPENDENT_AMBULATORY_CARE_PROVIDER_SITE_OTHER): Payer: BC Managed Care – PPO | Admitting: Family Medicine

## 2023-08-15 VITALS — BP 126/80 | HR 51 | Ht 74.0 in | Wt 190.0 lb

## 2023-08-15 DIAGNOSIS — Z970 Presence of artificial eye: Secondary | ICD-10-CM

## 2023-08-15 DIAGNOSIS — L4050 Arthropathic psoriasis, unspecified: Secondary | ICD-10-CM

## 2023-08-15 DIAGNOSIS — Z0000 Encounter for general adult medical examination without abnormal findings: Secondary | ICD-10-CM

## 2023-08-15 DIAGNOSIS — Z8042 Family history of malignant neoplasm of prostate: Secondary | ICD-10-CM

## 2023-08-15 DIAGNOSIS — Z860100 Personal history of colon polyps, unspecified: Secondary | ICD-10-CM

## 2023-08-15 DIAGNOSIS — E7801 Familial hypercholesterolemia: Secondary | ICD-10-CM

## 2023-08-15 DIAGNOSIS — M47819 Spondylosis without myelopathy or radiculopathy, site unspecified: Secondary | ICD-10-CM

## 2023-08-15 DIAGNOSIS — Z79899 Other long term (current) drug therapy: Secondary | ICD-10-CM

## 2023-08-15 MED ORDER — ATORVASTATIN CALCIUM 10 MG PO TABS: 10.0000 mg | ORAL_TABLET | Freq: Every day | ORAL | 3 refills | Status: AC

## 2023-08-15 NOTE — Progress Notes (Signed)
 Complete physical exam  Patient: Jacob Pena   DOB: 1960-10-08   63 y.o. Male  MRN: [id number]  Subjective:    Chief Complaint  Patient presents with   Annual Exam    Physical, fasting.     Jacob Pena is a 63 y.o. male who presents today for a complete physical exam.  He reports consuming a general diet. Gym/ health club routine includes mod to heavy weightlifting and functional strength training. He generally feels well. He reports sleeping well.  He is in a 6-year relationship which is going quite nicely.  He follows up regularly through rheumatology for psoriatic arthritis and continues to do quite nicely on folic acid , Rheumatrex and Cosentyx .  Continues on atorvastatin  without difficulty.  Does have a previous history of colonic polyps and is scheduled for routine follow-up concerning that.  He has no other concerns or complaints.  Most recent fall risk assessment:    08/15/2023    1:48 PM  Fall Risk   Falls in the past year? 0  Number falls in past yr: 0  Injury with Fall? 0  Risk for fall due to : No Fall Risks  Follow up Falls evaluation completed     Most recent depression screenings:    08/15/2023    1:48 PM 08/11/2022    1:52 PM  PHQ 2/9 Scores  PHQ - 2 Score 0 0    Vision:Within last year and Dental: No current dental problems and Receives regular dental care    Immunization History  Administered Date(s) Administered   Influenza Split 10/30/2013, 11/30/2016, 10/21/2021   Influenza,inj,Quad PF,6+ Mos 01/15/2016, 11/30/2016, 11/15/2017, 11/26/2018   Influenza-Unspecified 11/15/2017, 12/11/2018, 09/18/2020, 10/18/2022   Moderna Covid-19 Vaccine Bivalent Booster 32yrs & up 10/20/2020   Moderna SARS-COV2 Booster Vaccination 10/18/2022   Moderna Sars-Covid-2 Vaccination 04/04/2019, 05/02/2019, 11/21/2019, 04/21/2020   Pfizer(Comirnaty)Fall Seasonal Vaccine 12 years and older 11/25/2021   Pneumococcal Conjugate-13 07/02/2020   Pneumococcal Polysaccharide-23  11/17/2011   Td 02/01/2007   Tdap 07/02/2020   Zoster Recombinant(Shingrix ) 08/11/2022, 10/20/2022    Health Maintenance  Topic Date Due   COVID-19 Vaccine (7 - 2024-25 season) 12/13/2022   INFLUENZA VACCINE  09/01/2023   Colonoscopy  06/21/2025   DTaP/Tdap/Td (3 - Td or Tdap) 07/03/2030   Hepatitis C Screening  Completed   HIV Screening  Completed   Zoster Vaccines- Shingrix   Completed   Pneumococcal Vaccine 49-73 Years old  Aged Out   Hepatitis B Vaccines  Aged Out   HPV VACCINES  Aged Out   Meningococcal B Vaccine  Aged Out    Patient Care Team: Joyce Norleen BROCKS, MD as PCP - General (Family Medicine)   Outpatient Medications Prior to Visit  Medication Sig   Acetaminophen (TYLENOL PO) Take by mouth as needed.   Cholecalciferol (VITAMIN D3) 125 MCG (5000 UT) CAPS Take 5,000 Units by mouth daily.   COSENTYX  SENSOREADY, 300 MG, 150 MG/ML SOAJ INJECT 2 PENS UNDER THE SKIN EVERY 4 WEEKS   folic acid  (FOLVITE ) 1 MG tablet TAKE 2 TABLETS(2MG  TOTAL)  DAILY   methotrexate  (RHEUMATREX) 2.5 MG tablet Take 5 tablets (12.5 mg total) by mouth once a week. (PROTECT FROM LIGHT)   Multiple Vitamin (MULTIVITAMIN) tablet Take 1 tablet by mouth daily. [employer] Mens Multivitamin-Take one daily   Omega-3 Fatty Acids (FISH OIL PO) Take 1 capsule by mouth daily. Take 2 tablets daily   [DISCONTINUED] atorvastatin  (LIPITOR) 10 MG tablet Take 1 tablet (10 mg total) by mouth daily.  No facility-administered medications prior to visit.    Review of Systems  All other systems reviewed and are negative.   Family and social history as well as health maintenance and immunizations was reviewed.     Objective:    BP 126/80   Pulse (!) 51   Ht 6' 2 (1.88 m)   Wt 190 lb (86.2 kg)   SpO2 95%   BMI 24.39 kg/m    Physical Exam   No results found for any visits on 08/15/23.     Assessment & Plan:    Discussed health benefits of physical activity, and encouraged him to engage in regular exercise  appropriate for his age and condition.  Routine general medical examination at a health care facility  Spondyloarthropathy  Psoriatic arthritis (HCC) - Plan: CBC with Differential/Platelet, Comprehensive metabolic panel with GFR  Prosthetic eye globe  History of colonic polyps - Plan: CBC with Differential/Platelet, Comprehensive metabolic panel with GFR  High risk medication use  Family history of prostate cancer - Plan: PSA  Familial hypercholesterolemia - Plan: Lipid panel, atorvastatin  (LIPITOR) 10 MG tablet   Return in about 1 year (around 08/14/2024).      Norleen Jobs, MD

## 2023-08-16 LAB — CBC WITH DIFFERENTIAL/PLATELET
Basophils Absolute: 0 x10E3/uL (ref 0.0–0.2)
Basos: 1 %
EOS (ABSOLUTE): 0.2 x10E3/uL (ref 0.0–0.4)
Eos: 2 %
Hematocrit: 43 % (ref 37.5–51.0)
Hemoglobin: 14.3 g/dL (ref 13.0–17.7)
Immature Grans (Abs): 0 x10E3/uL (ref 0.0–0.1)
Immature Granulocytes: 0 %
Lymphocytes Absolute: 2.2 x10E3/uL (ref 0.7–3.1)
Lymphs: 34 %
MCH: 31.1 pg (ref 26.6–33.0)
MCHC: 33.3 g/dL (ref 31.5–35.7)
MCV: 94 fL (ref 79–97)
Monocytes Absolute: 0.5 x10E3/uL (ref 0.1–0.9)
Monocytes: 7 %
Neutrophils Absolute: 3.6 x10E3/uL (ref 1.4–7.0)
Neutrophils: 56 %
Platelets: 251 x10E3/uL (ref 150–450)
RBC: 4.6 x10E6/uL (ref 4.14–5.80)
RDW: 12.9 % (ref 11.6–15.4)
WBC: 6.4 x10E3/uL (ref 3.4–10.8)

## 2023-08-16 LAB — COMPREHENSIVE METABOLIC PANEL WITH GFR
ALT: 34 IU/L (ref 0–44)
AST: 33 IU/L (ref 0–40)
Albumin: 4.7 g/dL (ref 3.9–4.9)
Alkaline Phosphatase: 70 IU/L (ref 44–121)
BUN/Creatinine Ratio: 16 (ref 10–24)
BUN: 15 mg/dL (ref 8–27)
Bilirubin Total: 0.4 mg/dL (ref 0.0–1.2)
CO2: 19 mmol/L — ABNORMAL LOW (ref 20–29)
Calcium: 8.8 mg/dL (ref 8.6–10.2)
Chloride: 98 mmol/L (ref 96–106)
Creatinine, Ser: 0.96 mg/dL (ref 0.76–1.27)
Globulin, Total: 2.3 g/dL (ref 1.5–4.5)
Glucose: 90 mg/dL (ref 70–99)
Potassium: 4.3 mmol/L (ref 3.5–5.2)
Sodium: 136 mmol/L (ref 134–144)
Total Protein: 7 g/dL (ref 6.0–8.5)
eGFR: 90 mL/min/1.73 (ref >59)

## 2023-08-16 LAB — PSA: Prostate Specific Ag, Serum: 0.8 ng/mL (ref 0.0–4.0)

## 2023-08-16 LAB — LIPID PANEL
Chol/HDL Ratio: 3.7 ratio (ref 0.0–5.0)
Cholesterol, Total: 202 mg/dL — ABNORMAL HIGH (ref 100–199)
HDL: 54 mg/dL (ref >39)
LDL Chol Calc (NIH): 124 mg/dL — ABNORMAL HIGH (ref 0–99)
Triglycerides: 134 mg/dL (ref 0–149)
VLDL Cholesterol Cal: 24 mg/dL (ref 5–40)

## 2023-08-17 ENCOUNTER — Ambulatory Visit: Payer: Self-pay | Admitting: Family Medicine

## 2023-09-13 ENCOUNTER — Other Ambulatory Visit: Payer: Self-pay | Admitting: Rheumatology

## 2023-09-20 ENCOUNTER — Encounter: Payer: Self-pay | Admitting: Rheumatology

## 2023-10-18 ENCOUNTER — Other Ambulatory Visit: Payer: Self-pay | Admitting: Rheumatology

## 2023-10-18 NOTE — Telephone Encounter (Signed)
 Last Fill: 06/06/2023  Labs: 08/15/2023 CO2 19  TB Gold: 05/25/2023 negative    Next Visit: 12/01/2023  Last Visit: 05/25/2023  DX: Psoriatic arthritis   Current Dose per office note on 05/25/2023: Cosentyx  300 mg sq injections every 4 weeks   Okay to refill Cosentyx ?

## 2023-11-16 ENCOUNTER — Other Ambulatory Visit: Payer: Self-pay | Admitting: *Deleted

## 2023-11-16 DIAGNOSIS — Z79899 Other long term (current) drug therapy: Secondary | ICD-10-CM

## 2023-11-17 ENCOUNTER — Ambulatory Visit: Payer: Self-pay | Admitting: Physician Assistant

## 2023-11-17 DIAGNOSIS — Z79899 Other long term (current) drug therapy: Secondary | ICD-10-CM

## 2023-11-17 LAB — COMPREHENSIVE METABOLIC PANEL WITH GFR
AG Ratio: 2 (calc) (ref 1.0–2.5)
ALT: 54 U/L — ABNORMAL HIGH (ref 9–46)
AST: 41 U/L — ABNORMAL HIGH (ref 10–35)
Albumin: 4.7 g/dL (ref 3.6–5.1)
Alkaline phosphatase (APISO): 61 U/L (ref 35–144)
BUN: 18 mg/dL (ref 7–25)
CO2: 25 mmol/L (ref 20–32)
Calcium: 8.6 mg/dL (ref 8.6–10.3)
Chloride: 98 mmol/L (ref 98–110)
Creat: 1.09 mg/dL (ref 0.70–1.35)
Globulin: 2.4 g/dL (ref 1.9–3.7)
Glucose, Bld: 97 mg/dL (ref 65–99)
Potassium: 4.4 mmol/L (ref 3.5–5.3)
Sodium: 135 mmol/L (ref 135–146)
Total Bilirubin: 0.4 mg/dL (ref 0.2–1.2)
Total Protein: 7.1 g/dL (ref 6.1–8.1)
eGFR: 77 mL/min/1.73m2 (ref >=60)

## 2023-11-17 LAB — CBC WITH DIFFERENTIAL/PLATELET
Absolute Lymphocytes: 1667 {cells}/uL (ref 850–3900)
Absolute Monocytes: 776 {cells}/uL (ref 200–950)
Basophils Absolute: 11 {cells}/uL (ref 0–200)
Basophils Relative: 0.2 %
Eosinophils Absolute: 99 {cells}/uL (ref 15–500)
Eosinophils Relative: 1.8 %
HCT: 45.5 % (ref 38.5–50.0)
Hemoglobin: 15.3 g/dL (ref 13.2–17.1)
MCH: 31.8 pg (ref 27.0–33.0)
MCHC: 33.6 g/dL (ref 32.0–36.0)
MCV: 94.6 fL (ref 80.0–100.0)
MPV: 9.6 fL (ref 7.5–12.5)
Monocytes Relative: 14.1 %
Neutro Abs: 2948 {cells}/uL (ref 1500–7800)
Neutrophils Relative %: 53.6 %
Platelets: 241 Thousand/uL (ref 140–400)
RBC: 4.81 Million/uL (ref 4.20–5.80)
RDW: 13.1 % (ref 11.0–15.0)
Total Lymphocyte: 30.3 %
WBC: 5.5 Thousand/uL (ref 3.8–10.8)

## 2023-11-17 NOTE — Progress Notes (Signed)
 CBC WNL AST and ALT are both elevated-please clarify if he has been taking any tylenol? Alcohol? Any other medication changes?

## 2023-11-17 NOTE — Progress Notes (Signed)
 Office Visit Note  Patient: Jacob Pena             Date of Birth: 12-14-1960           MRN: [id number]             PCP: Joyce Norleen BROCKS, MD Referring: Joyce Norleen BROCKS, MD Visit Date: 12/01/2023 Occupation: Data Unavailable  Subjective:  Medication management  History of Present Illness: Jacob Pena is a 63 y.o. male with psoriatic arthritis.  He returns today after his last visit in April 2025.  He states he has not had a flare of psoriatic arthritis or psoriasis.  He denies symptoms of uveitis, plantar fasciitis or Achilles tendinitis.  He has been exercising on a regular basis.  He states he is careful exercising with his right shoulder.  He has not had any recent episode of medial epicondylitis.    Activities of Daily Living:  Patient reports morning stiffness for 0 minutes.   Patient Reports nocturnal pain.  Difficulty dressing/grooming: Denies Difficulty climbing stairs: Denies Difficulty getting out of chair: Denies Difficulty using hands for taps, buttons, cutlery, and/or writing: Denies  Review of Systems  Constitutional:  Negative for fatigue.  HENT:  Negative for mouth sores and mouth dryness.   Eyes:  Positive for dryness.  Respiratory:  Negative for shortness of breath.   Cardiovascular:  Negative for chest pain and palpitations.  Gastrointestinal:  Negative for blood in stool, constipation and diarrhea.  Endocrine: Negative for increased urination.  Genitourinary:  Negative for involuntary urination.  Musculoskeletal:  Positive for joint pain and joint pain. Negative for gait problem, joint swelling, myalgias, muscle weakness, morning stiffness, muscle tenderness and myalgias.  Skin:  Negative for color change, rash and sensitivity to sunlight.  Allergic/Immunologic: Negative for susceptible to infections.  Neurological:  Negative for dizziness and headaches.  Hematological:  Negative for swollen glands.  Psychiatric/Behavioral:  Negative for depressed mood  and sleep disturbance. The patient is not nervous/anxious.     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   Psoriatic arthritis (HCC) 01/08/2016   Spondyloarthropathy 01/08/2016   High risk medication use 01/08/2016   Familial hypercholesterolemia 05/18/2011    Past Medical History:  Diagnosis Date   Anxiety    Depression    per pt/ never had depression   Hyperlipidemia    Rheumatoid arthritis (HCC)     Family History  Problem Relation Age of Onset   Colon cancer Paternal Grandmother    Diverticulitis Mother    Breast cancer Mother    Prostate cancer Father    Dementia Father    Esophageal cancer Neg Hx    Rectal cancer Neg Hx    Stomach cancer Neg Hx    Past Surgical History:  Procedure Laterality Date   APPENDECTOMY     COLONOSCOPY  06/22/2018   EYE SURGERY     left eye/prosthetic eye   ROTATOR CUFF REPAIR Left 01/04/2021   STOMACH SURGERY  1994   floating ligament   TONSILLECTOMY     Social History   Tobacco Use   Smoking status: Never    Passive exposure: Never   Smokeless tobacco: Never  Vaping Use   Vaping status: Never Used  Substance Use Topics   Alcohol use: Yes    Comment: occ   Drug use: Not Currently  Social History   Social History Narrative   Not on file     Immunization History  Administered Date(s) Administered   Influenza Split 10/30/2013, 11/30/2016, 10/21/2021   Influenza,inj,Quad PF,6+ Mos 01/15/2016, 11/30/2016, 11/15/2017, 11/26/2018   Influenza-Unspecified 11/15/2017, 12/11/2018, 09/18/2020, 10/18/2022   Moderna Covid-19 Vaccine Bivalent Booster 40yrs & up 10/20/2020   Moderna SARS-COV2 Booster Vaccination 10/18/2022   Moderna Sars-Covid-2 Vaccination 04/04/2019, 05/02/2019, 11/21/2019, 04/21/2020   Pfizer(Comirnaty)Fall Seasonal Vaccine 12 years and older 11/25/2021    Pneumococcal Conjugate-13 07/02/2020   Pneumococcal Polysaccharide-23 11/17/2011   Td 02/01/2007   Tdap 07/02/2020   Zoster Recombinant(Shingrix ) 08/11/2022, 10/20/2022     Objective: Vital Signs: BP 139/85   Pulse 60   Temp 98.3 F (36.8 C)   Resp 16   Ht 6' 2.5 (1.892 m)   Wt 195 lb 9.6 oz (88.7 kg)   BMI 24.78 kg/m    Physical Exam Vitals and nursing note reviewed.  Constitutional:      Appearance: He is well-developed.  HENT:     Head: Normocephalic and atraumatic.  Eyes:     Conjunctiva/sclera: Conjunctivae normal.     Pupils: Pupils are equal, round, and reactive to light.  Cardiovascular:     Rate and Rhythm: Normal rate and regular rhythm.     Heart sounds: Normal heart sounds.  Pulmonary:     Effort: Pulmonary effort is normal.     Breath sounds: Normal breath sounds.  Abdominal:     General: Bowel sounds are normal.     Palpations: Abdomen is soft.  Musculoskeletal:     Cervical back: Normal range of motion and neck supple.  Skin:    General: Skin is warm and dry.     Capillary Refill: Capillary refill takes less than 2 seconds.  Neurological:     Mental Status: He is alert and oriented to person, place, and time.  Psychiatric:        Behavior: Behavior normal.      Musculoskeletal Exam: Cervical, thoracic and lumbar spine were in good range of motion.  There was no SI joint tenderness.  Shoulder joints, elbow joints, wrist joints, MCPs, PIPs and DIPs were in good range of motion with no synovitis.  Hip joints and knee joints were in good range of motion without any warmth swelling or effusion.  There was no tenderness over ankles or MTPs.   CDAI Exam: CDAI Score: -- Patient Global: --; Provider Global: -- Swollen: --; Tender: -- Joint Exam 12/01/2023   No joint exam has been documented for this visit   There is currently no information documented on the homunculus. Go to the Rheumatology activity and complete the homunculus joint  exam.  Investigation: No additional findings.  Imaging: No results found.  Recent Labs: Lab Results  Component Value Date   WBC 5.5 11/16/2023   HGB 15.3 11/16/2023   PLT 241 11/16/2023   NA 135 11/16/2023   K 4.4 11/16/2023   CL 98 11/16/2023   CO2 25 11/16/2023   GLUCOSE 97 11/16/2023   BUN 18 11/16/2023   CREATININE 1.09 11/16/2023   BILITOT 0.4 11/16/2023   ALKPHOS 70 08/15/2023   AST 41 (H) 11/16/2023   ALT 54 (H) 11/16/2023   PROT 7.1 11/16/2023   ALBUMIN 4.7 08/15/2023   CALCIUM  8.6 11/16/2023   GFRAA 72 02/07/2020   QFTBGOLDPLUS NEGATIVE 05/25/2023    Speciality Comments: No specialty comments available.  Procedures:  No procedures performed Allergies: Penicillins   Assessment / Plan:  Visit Diagnoses: Psoriatic arthritis (HCC)-he denies having a flare of psoriatic arthritis.  He has been taking Cosentyx  on a regular basis without any interruption.  Methotrexate  was discontinued due to elevated LFTs.  He denies symptoms of uveitis, plantar fasciitis or Achilles tendinitis.  He has had problems with medial epicondylitis in the past which is doing better.  He is more careful with his right shoulder where he had surgery.  He states he had an episode of and left-sided rib cage pain while he was traveling to [place] .  The symptoms resolved after taking Naprosyn.  High risk medication use - Cosentyx  300 mg sq injections every 4 weeks, (methotrexate  6 tablets by mouth once weekly, folic acid  2 mg po daily.-Discontinued) methotrexate  was initially reduced due to elevation in the creatinine.  Methotrexate  was discontinued after recent labs due to elevated LFTs.  Will recheck LFTs today.- Plan: ALT, AST.  Information reimmunization was placed in the AVS.  He was advised to hold Cosentyx  if he develops an infection resume after the infection resolves.  Elevated LFTs-LFTs were elevated recently.  Will repeat LFTs today.  Patient has been on Crestor and methotrexate .   Methotrexate  was discontinued as he has not had a flare in a long time.  Patient states he was traveling and few drinks of alcohol.  He also took Naprosyn for the recent discomfort in his upper back which resolved.  S/P arthroscopy of left shoulder - January 04, 2021 by Dr. Dozier for rotator cuff tear.  Doing better.  He is more careful but has not had any flares of shoulder joint discomfort.  Dyslipidemia-he is on Crestor.  July 2025 LDL 124  Medial epicondylitis of both elbows-symptoms resolved.  Prosthetic eye globe  History of colonic polyps  High risk medication use - Plan: ALT, AST  Orders: No orders of the defined types were placed in this encounter.  No orders of the defined types were placed in this encounter.    Follow-Up Instructions: Return in about 5 months (around 04/30/2024) for Psoriatic arthritis.   Maya Nash, MD  Note - This record has been created using Animal nutritionist.  Chart creation errors have been sought, but may not always  have been located. Such creation errors do not reflect on  the standard of medical care.

## 2023-11-17 NOTE — Progress Notes (Signed)
 Recommend reducing methotrexate  to 4 tablets weekly and rechecking AST and ALT in 2-3 weeks.

## 2023-11-20 MED ORDER — METHOTREXATE SODIUM 2.5 MG PO TABS: 10.0000 mg | ORAL_TABLET | ORAL | Status: AC

## 2023-12-01 ENCOUNTER — Encounter: Payer: Self-pay | Admitting: Rheumatology

## 2023-12-01 ENCOUNTER — Ambulatory Visit: Attending: Rheumatology | Admitting: Rheumatology

## 2023-12-01 VITALS — BP 139/85 | HR 60 | Temp 98.3°F | Resp 16 | Ht 74.5 in | Wt 195.6 lb

## 2023-12-01 DIAGNOSIS — Z79899 Other long term (current) drug therapy: Secondary | ICD-10-CM

## 2023-12-01 DIAGNOSIS — Z860100 Personal history of colon polyps, unspecified: Secondary | ICD-10-CM

## 2023-12-01 DIAGNOSIS — M7702 Medial epicondylitis, left elbow: Secondary | ICD-10-CM

## 2023-12-01 DIAGNOSIS — R7989 Other specified abnormal findings of blood chemistry: Secondary | ICD-10-CM

## 2023-12-01 DIAGNOSIS — E785 Hyperlipidemia, unspecified: Secondary | ICD-10-CM

## 2023-12-01 DIAGNOSIS — Z970 Presence of artificial eye: Secondary | ICD-10-CM

## 2023-12-01 DIAGNOSIS — Z9889 Other specified postprocedural states: Secondary | ICD-10-CM

## 2023-12-01 DIAGNOSIS — M7701 Medial epicondylitis, right elbow: Secondary | ICD-10-CM

## 2023-12-01 DIAGNOSIS — L4050 Arthropathic psoriasis, unspecified: Secondary | ICD-10-CM | POA: Diagnosis not present

## 2023-12-01 LAB — ALT: ALT: 58 U/L — ABNORMAL HIGH (ref 9–46)

## 2023-12-01 LAB — AST: AST: 50 U/L — ABNORMAL HIGH (ref 10–35)

## 2023-12-01 NOTE — Patient Instructions (Signed)
 Standing Labs We placed an order today for your standing lab work.   Please have your standing labs drawn in January and every 3 months  Please have your labs drawn 2 weeks prior to your appointment so that the provider can discuss your lab results at your appointment, if possible.  Please note that you may see your imaging and lab results in MyChart before we have reviewed them. We will contact you once all results are reviewed. Please allow our office up to 72 hours to thoroughly review all of the results before contacting the office for clarification of your results.  WALK-IN LAB HOURS  Monday through Thursday from 8:00 am -12:30 pm and 1:00 pm-4:30 pm and Friday from 8:00 am-12:00 pm.  Patients with office visits requiring labs will be seen before walk-in labs.  You may encounter longer than normal wait times. Please allow additional time. Wait times may be shorter on  Monday and Thursday afternoons.  We do not book appointments for walk-in labs. We appreciate your patience and understanding with our staff.   Labs are drawn by [facility]. Please bring your co-pay at the time of your lab draw.  You may receive a bill from [facility] for your lab work.  Please note if you are on Hydroxychloroquine and and an order has been placed for a Hydroxychloroquine level,  you will need to have it drawn 4 hours or more after your last dose.  If you wish to have your labs drawn at another location, please call the office 24 hours in advance so we can fax the orders.  The office is located at [address], [place], [place] [address]   If you have any questions regarding directions or hours of operation,  please call [phone].   As a reminder, please drink plenty of water prior to coming for your lab work. Thanks!   Vaccines You are taking a medication(s) that can suppress your immune system.  The following immunizations are recommended: Flu annually (high dose) RSV Covid-19  Td/Tdap  (tetanus, diphtheria, pertussis) every 10 years Pneumonia (Prevnar 15 then Pneumovax 23 at least 1 year apart.  Alternatively, can take Prevnar 20 without needing additional dose) Shingrix: 2 doses from 4 weeks to 6 months apart  Please check with your PCP to make sure you are up to date.   If you have signs or symptoms of an infection or start antibiotics: First, call your PCP for workup of your infection. Hold your medication through the infection, until you complete your antibiotics, and until symptoms resolve if you take the following: Injectable medication (Actemra, Benlysta, Cimzia, Cosentyx, Enbrel, Humira, Kevzara, Orencia, Remicade, Simponi, Stelara, Taltz, Tremfya) Methotrexate  Leflunomide (Arava) Mycophenolate (Cellcept) Earma, Olumiant, or Rinvoq

## 2023-12-04 ENCOUNTER — Ambulatory Visit: Payer: Self-pay | Admitting: Physician Assistant

## 2023-12-04 DIAGNOSIS — R7989 Other specified abnormal findings of blood chemistry: Secondary | ICD-10-CM

## 2023-12-04 DIAGNOSIS — Z79899 Other long term (current) drug therapy: Secondary | ICD-10-CM

## 2023-12-04 NOTE — Progress Notes (Signed)
 AST and ALT  remain elevated and have increased.  Please clarify if he reduced the dose of methotrexate ? Any tylenol? Alcohol use? Any other medication changes?

## 2023-12-05 NOTE — Progress Notes (Signed)
 Recommend remaining on reduced dose of methotrexate .  Avoid alcohol use and recheck LFTs again in 2-3 weeks. If LFTs remain elevated the dose of methotrexate  will need to be further reduced.

## 2023-12-06 ENCOUNTER — Other Ambulatory Visit: Payer: Self-pay | Admitting: Rheumatology

## 2023-12-06 NOTE — Telephone Encounter (Signed)
 I called patient and discussed methotrexate  dosing as he told during the last visit that he was not taking methotrexate .  He states he resumed methotrexate  4 tablets p.o. weekly after the last labs.  I advised him to discontinue methotrexate  as he was clinically doing well and LFTs are still elevated.  He was also advised not to drink alcohol.  Will recheck labs in 2 weeks as discussed.

## 2023-12-06 NOTE — Telephone Encounter (Signed)
 Last Fill: 09/13/2023  Labs: 12/01/2023  AST and ALT remain elevated and have increased  11/16/2023 CBC WNL AST and ALT are both elevated-Recommend reducing methotrexate  to 4 tablets weekly and rechecking AST and ALT in 2-3 weeks.   Next Visit: 06/13/2024  Last Visit: 12/01/2023  DX: Psoriatic arthritis   Current Dose per office note on 12/01/2023: (methotrexate  6 tablets by mouth once weekly, folic acid  2 mg po daily.-Discontinued) methotrexate  was initially reduced due to elevation in the creatinine.  Methotrexate  was discontinued after recent labs due to elevated LFTs.   Per lab note on 12/04/2023: AST and ALT remain elevated and have increased.   Patient advised AST and ALT  remain elevated and have increased.  Patient states he has reduced the MTX to 4 tabs weekly for 2 weeks. Patient states he does not take Tylenol but take Ibuprofen every once in a while. Patient states he was travelling the week prior to having his labs done. Patient states he was drinking alcohol for 4-5 days straight during that vacation. Patient states he has not drank alcohol since then. Patient states no medication changes but does have once scoop of creatine daily and drinks emergen-c daily.    Recommend remaining on reduced dose of methotrexate . Avoid alcohol use and recheck LFTs again in 2-3 weeks. If LFTs remain elevated the dose of methotrexate  will need to be further reduced.   Okay to refill Methotrexate  4 tablets once weekly?

## 2023-12-25 ENCOUNTER — Other Ambulatory Visit: Payer: Self-pay | Admitting: *Deleted

## 2023-12-25 DIAGNOSIS — Z79899 Other long term (current) drug therapy: Secondary | ICD-10-CM | POA: Diagnosis not present

## 2023-12-25 DIAGNOSIS — R7989 Other specified abnormal findings of blood chemistry: Secondary | ICD-10-CM | POA: Diagnosis not present

## 2023-12-26 ENCOUNTER — Ambulatory Visit: Payer: Self-pay | Admitting: Physician Assistant

## 2023-12-26 LAB — ALT: ALT: 23 U/L (ref 9–46)

## 2023-12-26 LAB — AST: AST: 29 U/L (ref 10–35)

## 2023-12-26 NOTE — Progress Notes (Signed)
AST and ALT WNL

## 2024-01-30 ENCOUNTER — Other Ambulatory Visit: Payer: Self-pay | Admitting: Physician Assistant

## 2024-01-30 NOTE — Telephone Encounter (Signed)
 Last Fill: 10/18/2023  Labs: 11/16/2023 CBC WNL AST and ALT are both elevated- 12/25/2023 AST and ALT WNL.   TB Gold: 05/25/2023 Neg    Next Visit: 06/13/2024   Last Visit: 12/01/2023  IK:Ednmpjupr arthritis   Current Dose per office note 12/01/2023: Cosentyx  300 mg sq injections every 4 weeks   Okay to refill Cosentyx ?

## 2024-06-13 ENCOUNTER — Ambulatory Visit: Admitting: Rheumatology

## 2024-08-19 ENCOUNTER — Encounter: Payer: Self-pay | Admitting: Family Medicine
